# Patient Record
Sex: Female | Born: 1980
Health system: Southern US, Community
[De-identification: ages and names within clinical notes are randomized; demographics above are authoritative.]

## PROBLEM LIST (undated history)

## (undated) ENCOUNTER — Inpatient Hospital Stay (HOSPITAL_COMMUNITY): Payer: Self-pay

## (undated) DIAGNOSIS — Z9889 Other specified postprocedural states: Secondary | ICD-10-CM

## (undated) DIAGNOSIS — R5383 Other fatigue: Secondary | ICD-10-CM

## (undated) DIAGNOSIS — K219 Gastro-esophageal reflux disease without esophagitis: Secondary | ICD-10-CM

## (undated) DIAGNOSIS — E871 Hypo-osmolality and hyponatremia: Secondary | ICD-10-CM

## (undated) DIAGNOSIS — T7840XA Allergy, unspecified, initial encounter: Secondary | ICD-10-CM

## (undated) DIAGNOSIS — O26892 Other specified pregnancy related conditions, second trimester: Secondary | ICD-10-CM

## (undated) DIAGNOSIS — D62 Acute posthemorrhagic anemia: Secondary | ICD-10-CM

## (undated) DIAGNOSIS — E063 Autoimmune thyroiditis: Secondary | ICD-10-CM

## (undated) DIAGNOSIS — N949 Unspecified condition associated with female genital organs and menstrual cycle: Secondary | ICD-10-CM

## (undated) DIAGNOSIS — L509 Urticaria, unspecified: Secondary | ICD-10-CM

## (undated) DIAGNOSIS — N915 Oligomenorrhea, unspecified: Secondary | ICD-10-CM

## (undated) DIAGNOSIS — R112 Nausea with vomiting, unspecified: Secondary | ICD-10-CM

## (undated) DIAGNOSIS — E041 Nontoxic single thyroid nodule: Secondary | ICD-10-CM

## (undated) DIAGNOSIS — R5381 Other malaise: Secondary | ICD-10-CM

## (undated) DIAGNOSIS — E049 Nontoxic goiter, unspecified: Secondary | ICD-10-CM

## (undated) DIAGNOSIS — N943 Premenstrual tension syndrome: Secondary | ICD-10-CM

## (undated) DIAGNOSIS — IMO0001 Reserved for inherently not codable concepts without codable children: Secondary | ICD-10-CM

## (undated) DIAGNOSIS — Z13220 Encounter for screening for lipoid disorders: Secondary | ICD-10-CM

## (undated) HISTORY — DX: Nontoxic goiter, unspecified: E04.9

## (undated) HISTORY — DX: Other malaise: R53.81

## (undated) HISTORY — DX: Other fatigue: R53.83

## (undated) HISTORY — DX: Hypo-osmolality and hyponatremia: E87.1

## (undated) HISTORY — DX: Premenstrual tension syndrome: N94.3

## (undated) HISTORY — DX: Nontoxic single thyroid nodule: E04.1

## (undated) HISTORY — DX: Allergy, unspecified, initial encounter: T78.40XA

## (undated) HISTORY — DX: Urticaria, unspecified: L50.9

## (undated) HISTORY — PX: TOTAL THYROIDECTOMY: SHX2547

## (undated) HISTORY — DX: Gastro-esophageal reflux disease without esophagitis: K21.9

## (undated) HISTORY — DX: Autoimmune thyroiditis: E06.3

## (undated) HISTORY — DX: Oligomenorrhea, unspecified: N91.5

## (undated) HISTORY — DX: Encounter for screening for lipoid disorders: Z13.220

---

## 2005-08-16 ENCOUNTER — Emergency Department: Payer: Self-pay | Admitting: Emergency Medicine

## 2005-08-31 HISTORY — PX: TONSILLECTOMY: SUR1361

## 2006-07-29 ENCOUNTER — Ambulatory Visit: Payer: Self-pay | Admitting: Otolaryngology

## 2006-08-16 ENCOUNTER — Ambulatory Visit: Payer: Self-pay | Admitting: Otolaryngology

## 2006-08-18 ENCOUNTER — Ambulatory Visit: Payer: Self-pay | Admitting: Otolaryngology

## 2006-08-22 ENCOUNTER — Emergency Department: Payer: Self-pay | Admitting: General Practice

## 2009-11-29 ENCOUNTER — Ambulatory Visit: Payer: Self-pay | Admitting: Family Medicine

## 2009-11-29 DIAGNOSIS — K219 Gastro-esophageal reflux disease without esophagitis: Secondary | ICD-10-CM

## 2009-11-29 DIAGNOSIS — J309 Allergic rhinitis, unspecified: Secondary | ICD-10-CM | POA: Insufficient documentation

## 2009-11-29 DIAGNOSIS — N943 Premenstrual tension syndrome: Secondary | ICD-10-CM | POA: Insufficient documentation

## 2009-11-29 DIAGNOSIS — E049 Nontoxic goiter, unspecified: Secondary | ICD-10-CM | POA: Insufficient documentation

## 2009-11-29 DIAGNOSIS — E871 Hypo-osmolality and hyponatremia: Secondary | ICD-10-CM | POA: Insufficient documentation

## 2009-11-29 LAB — CONVERTED CEMR LAB
AST: 16 units/L (ref 0–37)
Albumin: 4 g/dL (ref 3.5–5.2)
Alkaline Phosphatase: 44 units/L (ref 39–117)
Bilirubin, Direct: 0 mg/dL (ref 0.0–0.3)
Calcium: 9.2 mg/dL (ref 8.4–10.5)
GFR calc non Af Amer: 107.14 mL/min (ref >60)
Glucose, Bld: 82 mg/dL (ref 70–99)
HDL: 96 mg/dL (ref >39.00)
LDL Cholesterol: 58 mg/dL (ref 0–99)
Potassium: 3.5 meq/L (ref 3.5–5.1)
Sodium: 132 meq/L — ABNORMAL LOW (ref 135–145)
TSH: 1.01 microintl units/mL (ref 0.35–5.50)
Total Bilirubin: 0.3 mg/dL (ref 0.3–1.2)
Total CHOL/HDL Ratio: 2
VLDL: 18.4 mg/dL (ref 0.0–40.0)

## 2009-12-05 ENCOUNTER — Encounter: Admission: RE | Admit: 2009-12-05 | Discharge: 2009-12-05 | Payer: Self-pay | Admitting: Family Medicine

## 2009-12-06 DIAGNOSIS — E041 Nontoxic single thyroid nodule: Secondary | ICD-10-CM

## 2009-12-29 LAB — CONVERTED CEMR LAB: Pap Smear: NORMAL

## 2010-01-06 ENCOUNTER — Encounter (INDEPENDENT_AMBULATORY_CARE_PROVIDER_SITE_OTHER): Payer: Self-pay | Admitting: *Deleted

## 2010-01-13 ENCOUNTER — Telehealth: Payer: Self-pay | Admitting: Family Medicine

## 2010-01-14 ENCOUNTER — Telehealth: Payer: Self-pay | Admitting: Family Medicine

## 2010-01-14 ENCOUNTER — Ambulatory Visit: Payer: Self-pay

## 2010-01-14 ENCOUNTER — Ambulatory Visit: Payer: Self-pay | Admitting: Family Medicine

## 2010-01-17 ENCOUNTER — Encounter: Payer: Self-pay | Admitting: Family Medicine

## 2010-01-21 ENCOUNTER — Telehealth: Payer: Self-pay | Admitting: Family Medicine

## 2010-06-05 ENCOUNTER — Telehealth: Payer: Self-pay | Admitting: Family Medicine

## 2010-06-06 ENCOUNTER — Encounter: Admission: RE | Admit: 2010-06-06 | Discharge: 2010-06-06 | Payer: Self-pay | Admitting: Family Medicine

## 2010-08-06 ENCOUNTER — Telehealth: Payer: Self-pay | Admitting: Family Medicine

## 2010-09-30 NOTE — Progress Notes (Signed)
Summary: ??[place] ultrasound for tomorrow  Phone Note Call from Patient Call back at Home Phone [phone]   Caller: Patient Call For: Kerby Nora MD Summary of Call: Patient is going tomorrow for an ultrasound of her thyroid tomorrow. She says that she hasn't been feeling good for a couple of weeks and that her lymph node on the right side of her neck is swollen. She wants to know if she should reschedule the ultrasound for after she is feeling better and her lymph node has gone down. Please advise.  Initial call taken by: Melody Comas,  June 05, 2010 9:56 AM  Follow-up for Phone Call        no, go to [name] [facility] MD  June 05, 2010 10:03 AM   Additional Follow-up for Phone Call Additional follow up Details #1::        Patient advised via message on personal cell phone.Consuello Masse CMA   Additional Follow-up by: Benny Lennert CMA Duncan Dull),  June 05, 2010 10:16 AM

## 2010-09-30 NOTE — Miscellaneous (Signed)
Summary: Optometrist Release   Imported By: Beau Fanny 11/29/2009 11:00:15  _____________________________________________________________________  External Attachment:    Type:   Image     Comment:   External Document

## 2010-09-30 NOTE — Progress Notes (Signed)
Summary: Epi Pen  Phone Note Refill Request Message from:  Fax from Pharmacy on Jan 21, 2010 2:59 PM  Refills Requested: Medication #1:  EPIPEN 2-PAK 0.3 MG/0.3ML DEVI 1 injection as directed for anaphylaxsis. [facility]   (Form in your in box)   Method Requested: Electronic Initial call taken by: Delilah Shan CMA Duncan Dull),  Jan 21, 2010 2:59 PM

## 2010-09-30 NOTE — Progress Notes (Signed)
Summary: Call Report/Venous Doppler  Phone Note From Other Clinic   Caller: Mary/Venous Doppler Call For: Dr. Dayton Martes Summary of Call: Negative for DVT.  Patient sent home. Initial call taken by: Linde Gillis CMA Duncan Dull),  Jan 14, 2010 3:21 PM  Follow-up for Phone Call        Aware..forward to Dr. Dayton Martes.  Follow-up by: Kerby Nora MD,  Jan 14, 2010 4:05 PM

## 2010-09-30 NOTE — Assessment & Plan Note (Signed)
Summary: NEW PT TO EST/CLE   Vital Signs:  Patient profile:   30 year old female Height:      66.5 inches Weight:      141 pounds BMI:     22.50 Temp:     98.1 degrees F oral Pulse rate:   72 / minute Pulse rhythm:   regular BP sitting:   110 / 62  (left arm) Cuff size:   regular  Vitals Entered By: Linde Gillis CMA Duncan Dull) (November 29, 2009 9:56 AM) CC: new patient/establish care   History of Present Illness: Here to estab;ish. Doing well overall.    Unexplained food allergy...has had full allergy testing..bloating diarream sweling in hands and face after eating rarely.  Needs refill of epi pen.   Preventive Screening-Counseling & Management  Alcohol-Tobacco     Smoking Status: never  Caffeine-Diet-Exercise     Does Patient Exercise: no      Drug Use:  no.    Problems Prior to Update: None  Current Medications (verified): 1)  Nuvaring 0.12-0.015 Mg/24hr Ring (Etonogestrel-Ethinyl Estradiol) .... Use As Directed  Allergies (verified): No Known Drug Allergies  Past History:     Past Medical History: GYN: Dr. Edward Jolly  Past Surgical History: 2007 tonsillectomy  Family History: Reviewed history and no changes required. mother with celiac disease father: arthritis Aunt: breast cancer 1 brother: healthy  Social History: Reviewed history and no changes required. 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 CV:  Denies chest pain or discomfort. Resp:  Denies shortness of breath. GI:  Denies abdominal pain. GU:  Denies abnormal vaginal bleeding and dysuria. MS:  Denies muscle weakness; upper back pain.Marland Kitchengets massages.Marland Kitchenand  yoga helps a lot. Not severe enough to take pain med.  . Derm:  Denies hair loss. Neuro:  Denies numbness, poor balance, and tingling. Psych:  Denies anxiety and depression. Endo:  Denies cold intolerance, heat intolerance, polyuria, and weight change.  Physical Exam  General:  Well-developed,well-nourished,in no acute distress; alert,appropriate and cooperative throughout examination Head:  no maxillary sinus ttp Eyes:  Mild B conjuctiva erythematous Ears:  clear fluid B TMS Nose:  nasal dischargemucosal pallor.   Mouth:  Oral mucosa and oropharynx without lesions or exudates.  Teeth in good repair. post nasal drip Neck:  no cervical or supraclavicular lymphadenopathy  mild thyromegaly Lungs:  Normal respiratory effort, chest expands symmetrically. Lungs are clear to auscultation, no crackles or wheezes. Heart:  Normal rate and regular rhythm. S1 and S2 normal without gallop, murmur, click, rub or other extra sounds. Abdomen:  Bowel sounds positive,abdomen soft and non-tender without masses, organomegaly or hernias noted. Pulses:  R and L posterior tibial pulses are full and equal bilaterally  Extremities:  no edema Skin:  Intact without suspicious lesions or rashes Psych:  Cognition and judgment appear intact. Alert and cooperative with normal attention span and concentration. No apparent delusions, illusions, hallucinations   Impression & Recommendations:  Problem # 1:  THYROMEGALY (ICD-240.9) grandmother with thyro issues. pt assymptomatic other than thyromegaly.  Orders: TLB-TSH (Thyroid Stimulating Hormone) (84443-TSH)  Problem # 2:  ALLERGIC RHINITIS (ICD-477.9) Zyrtec at bedtime, no decongestant due to nasal dryness, astepro sample she has at home already..more  consistently. Call if not improving for ? nasal steroid.  Her updated medication list for this problem includes:    Zyrtec Hives Relief 10 Mg Tabs (Cetirizine hcl) .Marland Kitchen... 1 tab by mouth at bedtime    Astepro 0.15 %  Soln (Azelastine hcl) .Marland Kitchen... 2 sprays per nostril daily.  Problem # 3:  PAIN IN THORACIC SPINE (ICD-724.1) Musculoskeletal starin. Treat with massage, heat, stretching info given. NSAIDs as needed. Follow up if not improving.  Problem # 4:  SCREENING FOR LIPOID DISORDERS (ICD-V77.91)  Orders: TLB-BMP (Basic Metabolic Panel-BMET) (80048-METABOL) TLB-Hepatic/Liver Function Pnl (80076-HEPATIC) TLB-Lipid Panel (80061-LIPID)  Complete Medication List: 1)  Nuvaring 0.12-0.015 Mg/24hr Ring (Etonogestrel-ethinyl estradiol) .... Use as directed 2)  Zyrtec Hives Relief 10 Mg Tabs (Cetirizine hcl) .Marland Kitchen.. 1 tab by mouth at bedtime 3)  Astepro 0.15 % Soln (Azelastine hcl) .... 2 sprays per nostril daily. 4)  Epipen 2-pak 0.3 Mg/0.20ml Devi (Epinephrine) .Marland Kitchen.. 1 injection as directed for anaphylaxsis  Patient Instructions: 1)  Use Zyrtec at bedtime and asteproduring day 2 sprays per nostril daily. 2)  Heat, massage, gentle stretches for back pain. Restart Yoga. 3)  Follow up if allergies not controlled or back pain not improving. Prescriptions: EPIPEN 2-PAK 0.3 MG/0.3ML DEVI (EPINEPHRINE) 1 injection as directed for anaphylaxsis  #1 pack x 1   Entered and Authorized by:   Kerby Nora MD   Signed by:   Kerby Nora MD on 11/29/2009   Method used:   Electronically to        [facility]* (mail-order)       [address] [place], [place]  [address]       Ph: [phone]       Fax: [phone]   RxID:   [id number]   Current Allergies (reviewed today): No known allergies   Flu Vaccine Result Date:  05/31/2009 Flu Vaccine Result:  given Flu Vaccine Next Due:  1 yr TD Result Date:  08/31/2002 TD Result:  given TD Next Due:  10 yr PAP Result Date:  12/29/2009 PAP Result:  normal PAP Next Due:  1 yr

## 2010-09-30 NOTE — Miscellaneous (Signed)
Summary: Orders Update  Clinical Lists Changes  Orders: Added new Test order of Venous Duplex Lower Extremity (Venous Duplex Lower) - Signed 

## 2010-09-30 NOTE — Progress Notes (Signed)
Summary: has sinus sxs  Phone Note Call from Patient Call back at Home Phone [phone]   Caller: Patient Call For: Kerby Nora MD Summary of Call: Pt called complaing of sinus sxs.  She asked that abx be called in. Advised her that she would need to be seen first but she says she just started a new job and has a $6000.00 insurance deductible so she wont be coming in. Initial call taken by: Lowella Petties CMA, AAMA,  August 06, 2010 11:40 AM  Follow-up for Phone Call        Start with OTc mucinex D, nasal steroid spray three times a day... make appt to be seen if not improving in 4-5 days.  Follow-up by: Kerby Nora MD,  August 06, 2010 11:56 AM  Additional Follow-up for Phone Call Additional follow up Details #1::        Patient advised.Consuello Masse CMA   Additional Follow-up by: Benny Lennert CMA Duncan Dull),  August 07, 2010 8:23 AM

## 2010-09-30 NOTE — Consult Note (Signed)
Summary: [facility]   Imported By: Lanelle Bal 02/06/2010 09:33:16  _____________________________________________________________________  External Attachment:    Type:   Image     Comment:   External Document  Appended Document: Orders Update    Clinical Lists Changes  Observations: Added new observation of TSH: 1 microintl units/mL (01/14/2010 16:44)

## 2010-09-30 NOTE — Progress Notes (Signed)
Summary: pain in lower leg  Phone Note Call from Patient   Caller: Patient Call For: Dr. Patsy Lager Summary of Call: Pt called and stated  that she has pain and swelling in her lower leg.  Not red, not hot or cold to  the touch.  Feels tight.  Pain is constant.  She is concerned because she has been on birth control  pills for several years.  Per Dr. Patsy Lager advised pt she need to go to ER to be evaluated for a clot.  Pt states she doesnt want to pay emergency room fee and will think about it. Initial call taken by: Lowella Petties CMA,  Jan 13, 2010 4:14 PM  Follow-up for Phone Call        Urgent care for eval. This can be emergent prob Follow-up by: Hannah Beat MD,  Jan 13, 2010 4:33 PM    Additional Follow-up for Phone Call Additional follow up Details #2::    Advised pt. Follow-up by: Lowella Petties CMA,  Jan 13, 2010 4:49 PM

## 2010-09-30 NOTE — Letter (Signed)
Summary: [place] No Show Letter  [place] [facility]  [address] [place], [place] [address]   Phone: [phone]  Fax: [phone]    01/06/2010 MRN: [id number]  Lindsey Johnson [address] [place], [place]  [address]   Dear Lindsey Johnson,   Our records indicate that you missed your scheduled appointment with _______Lab______________ on ____5/6/11________.  Please contact this office to reschedule your appointment as soon as possible.  It is important that you keep your scheduled appointments with your physician, so we can provide you the best care possible.  Please be advised that there may be a charge for "no show" appointments.    Sincerely,   [place] at [facility]

## 2010-09-30 NOTE — Assessment & Plan Note (Signed)
Summary: SWOLLEN R LEG/CLE   Vital Signs:  Patient profile:   30 year old female Height:      66.5 inches Weight:      144.13 pounds BMI:     23.00 Temp:     98.7 degrees F oral Pulse rate:   72 / minute Pulse rhythm:   regular BP sitting:   110 / 70  (left arm) Cuff size:   regular  Vitals Entered By: Linde Gillis CMA Duncan Dull) (Jan 14, 2010 11:49 AM) CC: swollen right leg   History of Present Illness: 30 yo with acute onset of right calf pain.  Pain woke her up from sleep sunday night.  Has been taking Ipubrofen and 325 mg ASA daily with no relief of symptoms.  Very sore to touch. No redness or swelling.  No shortness of breath, cough or chest pain.  She is not a smoker but has been on hormonal birth control for 9 years, currently on Nuvaring.  Current Medications (verified): 1)  Nuvaring 0.12-0.015 Mg/24hr Ring (Etonogestrel-Ethinyl Estradiol) .... Use As Directed 2)  Zyrtec Hives Relief 10 Mg Tabs (Cetirizine Hcl) .Marland Kitchen.. 1 Tab By Mouth At Bedtime 3)  Epipen 2-Pak 0.3 Mg/0.64ml Devi (Epinephrine) .Marland Kitchen.. 1 Injection As Directed For Anaphylaxsis  Allergies (verified): No Known Drug Allergies  Review of Systems      See HPI General:  Denies malaise. CV:  Denies chest pain or discomfort. Resp:  Denies shortness of breath.  Physical Exam  General:  Well-developed,well-nourished,in no acute distress; alert,appropriate and cooperative throughout examination Lungs:  Normal respiratory effort, chest expands symmetrically. Lungs are clear to auscultation, no crackles or wheezes. Heart:  Normal rate and regular rhythm. S1 and S2 normal without gallop, murmur, click, rub or other extra sounds. Msk:  Right calf:  tender to palpation, no erythema or swelling, no palpable cord. Pulses:  dorsalis pedis pulses normal bilaterally. Psych:  Cognition and judgment appear intact. Alert and cooperative with normal attention span and concentration. No apparent delusions, illusions,  hallucinations   Impression & Recommendations:  Problem # 1:  CALF PAIN, RIGHT (ICD-729.5) Assessment New History and physically concerning for thrombosis.  Will send for urgent doppler of right lower extremity. Orders: Radiology Referral (Radiology)  Complete Medication List: 1)  Nuvaring 0.12-0.015 Mg/24hr Ring (Etonogestrel-ethinyl estradiol) .... Use as directed 2)  Zyrtec Hives Relief 10 Mg Tabs (Cetirizine hcl) .Marland Kitchen.. 1 tab by mouth at bedtime 3)  Epipen 2-pak 0.3 Mg/0.79ml Devi (Epinephrine) .Marland Kitchen.. 1 injection as directed for anaphylaxsis  Patient Instructions: 1)  nice to meet you. 2)  please stop by to see Shirlee Limerick on your way out. 3)  we will call you as soon as we have results.  Current Allergies (reviewed today): No known allergies

## 2010-09-30 NOTE — Progress Notes (Signed)
Summary: leg is still sore  Phone Note Call from Patient Call back at Home Phone [phone]   Caller: Patient Call For: Dr. Dayton Martes Summary of Call: Pt had doppler study done on her leg today.  There were no clolts so she is asking what is the next step.  Her leg is still very sore, feels tight but not swollen.  Please advise. Initial call taken by: Lowella Petties CMA,  Jan 14, 2010 5:05 PM  Follow-up for Phone Call        I would like to see formal report.  Were their superficial clots?  I would take Ibuprofen 800 mg three times daily until I can see the official report (not just call report).  Thanks. Ruthe Mannan MD  Jan 15, 2010 7:32 AM  Left message on cell phone voicemail advising patient as instructed.   Follow-up by: Linde Gillis CMA Duncan Dull),  Jan 15, 2010 7:58 AM

## 2010-10-06 ENCOUNTER — Telehealth: Payer: Self-pay | Admitting: Family Medicine

## 2010-10-07 ENCOUNTER — Other Ambulatory Visit: Payer: Self-pay | Admitting: Family Medicine

## 2010-10-07 ENCOUNTER — Encounter: Payer: Self-pay | Admitting: Family Medicine

## 2010-10-07 ENCOUNTER — Ambulatory Visit (INDEPENDENT_AMBULATORY_CARE_PROVIDER_SITE_OTHER): Payer: Self-pay | Admitting: Family Medicine

## 2010-10-07 DIAGNOSIS — R5383 Other fatigue: Secondary | ICD-10-CM

## 2010-10-07 DIAGNOSIS — N915 Oligomenorrhea, unspecified: Secondary | ICD-10-CM | POA: Insufficient documentation

## 2010-10-07 DIAGNOSIS — E049 Nontoxic goiter, unspecified: Secondary | ICD-10-CM

## 2010-10-07 DIAGNOSIS — R5381 Other malaise: Secondary | ICD-10-CM | POA: Insufficient documentation

## 2010-10-07 LAB — PREGNANCY SERUM, QUANT: hCG, Beta Chain, Quant, S: <0.5 m[IU]/mL

## 2010-10-07 LAB — T3, FREE: T3, Free: 5.1 pg/mL — ABNORMAL HIGH (ref 2.3–4.2)

## 2010-10-13 ENCOUNTER — Other Ambulatory Visit (HOSPITAL_COMMUNITY): Payer: Self-pay | Admitting: Endocrinology

## 2010-10-13 DIAGNOSIS — E0590 Thyrotoxicosis, unspecified without thyrotoxic crisis or storm: Secondary | ICD-10-CM

## 2010-10-16 NOTE — Assessment & Plan Note (Signed)
Summary: FATIGUE/CLE    [employer]   Vital Signs:  Patient profile:   30 year old female Height:      66.5 inches Weight:      151 pounds BMI:     24.09 Temp:     98.5 degrees F oral Pulse rate:   72 / minute Pulse rhythm:   regular BP sitting:   110 / 62  (left arm) Cuff size:   regular  Vitals Entered By: Benny Lennert CMA Duncan Dull) (October 07, 2010 8:45 AM)  History of Present Illness: Chief complaint fatigue and wants tsh checked  30 year old female with thyromegaly and multinodular goiter.. seen by Dr. Talmage Nap presnets to day with: In the last month ... she has been feeling very fatigued. Last thyroid US.. stable in 05/2010. TSH nml at that time as well. Reviewed last OV note from ENDO: nml TPO antibody titer, nml TSH.  Plan is serial [name] and possible aspiration of nodule considering pt interested in getting pregnant.   Family and friends have noted that thyroid looks larger  than in past.   On prenatal vitamins.  This last menses.Marland Kitchen is 6 days late.  She has stopped OCPs in 07/2010.  MGM: thyrpid issues  More gassy, hiccups.... Has taken multiple home preg test neg  No constipation, has dry skin,  always feels cold but this is usual for her.   Problems Prior to Update: 1)  Calf Pain, Right  (ICD-729.5) 2)  Lymphadenopathy  (ICD-785.6) 3)  Thyroid Nodule  (ICD-241.0) 4)  Hyponatremia  (ICD-276.1) 5)  Thyromegaly  (ICD-240.9) 6)  Allergic Rhinitis  (ICD-477.9) 7)  Pain in Thoracic Spine  (ICD-724.1) 8)  Screening For Lipoid Disorders  (ICD-V77.91) 9)  Migraine, Menstrual  (ICD-625.4) 10)  Gastroesophageal Reflux Disease  (ICD-530.81)  Current Medications (verified): 1)  Epipen 2-Pak 0.3 Mg/0.79ml Devi (Epinephrine) .Marland Kitchen.. 1 Injection As Directed For Anaphylaxsis  Allergies (verified): No Known Drug Allergies  Past History:  Past medical, surgical, family and social histories (including risk factors) reviewed, and no changes noted (except as noted below).  Past  Medical History: Reviewed history from 11/29/2009 and no changes required. GYN: Dr. Edward Jolly  Past Surgical History: Reviewed history from 11/29/2009 and no changes required. 2007 tonsillectomy  Family History: Reviewed history from 11/29/2009 and no changes required. mother with celiac disease father: arthritis Aunt: breast cancer 1 brother: healthy  Social History: Reviewed history from 11/29/2009 and no changes required. 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  Review of Systems General:  Complains of fatigue; denies fever. CV:  Denies chest pain or discomfort. Resp:  Denies shortness of breath.  Physical Exam  General:  Well-developed,well-nourished,in no acute distress; alert,appropriate and cooperative throughout examination Mouth:  MMM Neck:  thyromegaly.. no distinct nodules palpated.  Lungs:  Normal respiratory effort, chest expands symmetrically. Lungs are clear to auscultation, no crackles or wheezes. Heart:  Normal rate and regular rhythm. S1 and S2 normal without gallop, murmur, click, rub or other extra sounds. Abdomen:  Bowel sounds positive,abdomen soft and non-tender without masses, organomegaly or hernias noted.   Impression & Recommendations:  Problem # 1:  FATIGUE (ICD-780.79) Assessment New Eval with labs.  Orders: TLB-TSH (Thyroid Stimulating Hormone) (84443-TSH) TLB-Hemoglobin (Hgb) (85018-HGB)  Problem # 2:  OLIGOMENORRHEA (ICD-626.1) Assessment: New Will eval serum test given pt concern... she feels  like she did when she was preg in past. Orders: TLB-Preg Serum Quant (B-hCG) (84702-HCG-QN)  Problem # 3:  THYROID NODULE (ICD-241.0)  Problem # 4:  THYROMEGALY (ICD-240.9)  Orders: TLB-T3, Free (Triiodothyronine) (84481-T3FREE) TLB-T4 (Thyrox),  Free [phone])  Complete Medication List: 1)  Epipen 2-pak 0.3 Mg/0.33ml Devi (Epinephrine) .Marland Kitchen.. 1 injection as directed for anaphylaxsis   Orders Added: 1)  TLB-Preg Serum Quant (B-hCG) [84702-HCG-QN] 2)  TLB-TSH (Thyroid Stimulating Hormone) [84443-TSH] 3)  TLB-Hemoglobin (Hgb) [85018-HGB] 4)  TLB-T3, Free (Triiodothyronine) [29528-U1LKGM] 5)  TLB-T4 (Thyrox), Free [01027-OZ3G] 6)  Est. Patient Level III [64403]    Current Allergies (reviewed today): No known allergies

## 2010-10-16 NOTE — Progress Notes (Signed)
Summary: wants thyroid checked  Phone Note Call from Patient Call back at Home Phone [phone] [facility]   Call back at [phone]   Caller: Patient Summary of Call: Pt states she stopped taking birth control pills in november, had normal periods in december and january, and is now 5 days late- has done 3 negative pregnancy tests.  She states she is feeling extremely tired and thinks that her problem is her throid and would like to have this checked.  Please advise. Initial call taken by: Lowella Petties CMA, AAMA,  October 06, 2010 1:19 PM  Follow-up for Phone Call        [facility] D[facility] 244.9 Follow-up by: Kerby Nora MD,  October 07, 2010 8:13 AM  Additional Follow-up for Phone Call Additional follow up Details #1::        Patient has appt today can do at appt.Consuello Masse CMA   Additional Follow-up by: Benny Lennert CMA Duncan Dull),  October 07, 2010 8:38 AM

## 2010-10-27 ENCOUNTER — Ambulatory Visit (HOSPITAL_COMMUNITY): Payer: Self-pay

## 2010-10-28 ENCOUNTER — Ambulatory Visit: Payer: Self-pay | Admitting: Otolaryngology

## 2010-10-28 ENCOUNTER — Other Ambulatory Visit (HOSPITAL_COMMUNITY): Payer: Self-pay

## 2010-11-03 ENCOUNTER — Ambulatory Visit: Payer: Self-pay | Admitting: Otolaryngology

## 2010-11-05 LAB — PATHOLOGY REPORT

## 2010-11-10 ENCOUNTER — Ambulatory Visit: Payer: Self-pay | Admitting: Otolaryngology

## 2010-11-17 ENCOUNTER — Ambulatory Visit: Payer: Self-pay | Admitting: Otolaryngology

## 2010-11-24 ENCOUNTER — Ambulatory Visit: Payer: Self-pay | Admitting: Endocrinology

## 2010-12-01 ENCOUNTER — Ambulatory Visit (INDEPENDENT_AMBULATORY_CARE_PROVIDER_SITE_OTHER): Payer: BC Managed Care – PPO | Admitting: Family Medicine

## 2010-12-01 ENCOUNTER — Encounter: Payer: Self-pay | Admitting: Family Medicine

## 2010-12-01 VITALS — BP 110/70 | HR 92 | Temp 98.3°F | Ht 66.0 in | Wt 154.1 lb

## 2010-12-01 DIAGNOSIS — J029 Acute pharyngitis, unspecified: Secondary | ICD-10-CM

## 2010-12-01 DIAGNOSIS — B9789 Other viral agents as the cause of diseases classified elsewhere: Secondary | ICD-10-CM

## 2010-12-01 LAB — POCT RAPID STREP A (OFFICE): Rapid Strep A Screen: NEGATIVE

## 2010-12-01 NOTE — Patient Instructions (Signed)
Drink plenty of fluids, take tylenol as needed, and gargle with warm salt water for your throat.  This should gradually improve.  Take care.  Let us know if you have other concerns.    

## 2010-12-01 NOTE — Progress Notes (Signed)
duration of symptoms: Started last night with ST.   rhinorrhea:no congestion:no ear pain:no sore throat: yes Cough: no Myalgias:yes other concerns: takes zyrtec for allergies.   + Nausea, sinus pressure.  Some diffuse abdominal pain. No fever.    Works at a [employer].    ROS: See HPI.  Otherwise negative.    Meds, vitals, and allergies reviewed.   GEN: nad, alert and oriented HEENT: mucous membranes moist, TM w/o erythema, nasal epithelium injected, OP with cobblestoning but no exudates, max sinus mildly ttp bilaterally NECK: supple w/o LA, surgical scar healing CV: rrr. PULM: ctab, no inc wob ABD: soft, +bs, no focal tenderness, no RLQ tenderness, no rebound EXT: no edema

## 2010-12-01 NOTE — Assessment & Plan Note (Signed)
Nontoxic, not dehydrated, RST neg and okay for outpatient fu.  See instructions.

## 2010-12-03 ENCOUNTER — Telehealth: Payer: Self-pay | Admitting: *Deleted

## 2010-12-03 MED ORDER — ONDANSETRON HCL 4 MG PO TABS: 4.0000 mg | ORAL_TABLET | Freq: Three times a day (TID) | ORAL | Status: DC | PRN

## 2010-12-03 NOTE — Telephone Encounter (Signed)
Please call in (I didn't send it electronically- please verify the pharmacy).  Thanks.

## 2010-12-03 NOTE — Telephone Encounter (Signed)
Patient was seen for virus on Monday and she says that she is still feeling nauseated and has been throwing up. She is asking if she can have Zofran called in. Please advise. [facility]

## 2010-12-03 NOTE — Telephone Encounter (Signed)
Phoned to pharmacy 

## 2010-12-29 ENCOUNTER — Ambulatory Visit: Payer: Self-pay | Admitting: Endocrinology

## 2011-01-29 ENCOUNTER — Ambulatory Visit: Payer: Self-pay | Admitting: Endocrinology

## 2011-02-15 IMAGING — US US SOFT TISSUE HEAD/NECK
1 series · 14 of 25 positions shown · non-contrast
Comparison: None.

CLINICAL DATA: Thyroid enlargement.

THYROID ULTRASOUND
TECHNIQUE: Ultrasound examination of the thyroid gland and
adjacent soft tissues was performed.

[Series 1: us soft tissue head/neck · 0.06mm/px · 14 of 34 slices shown]
[im 1/34]
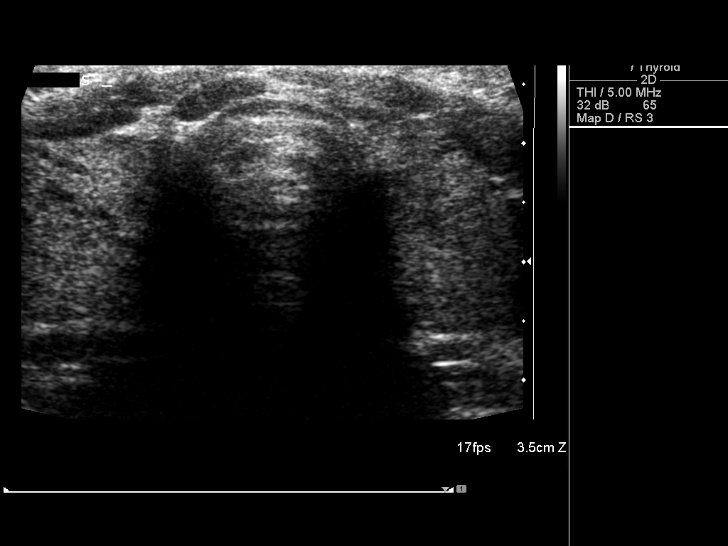
[im 3/34]
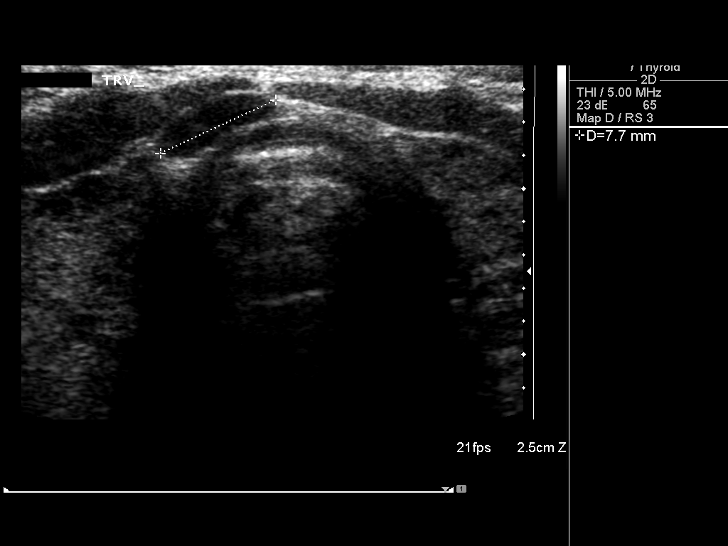
[im 6/34]
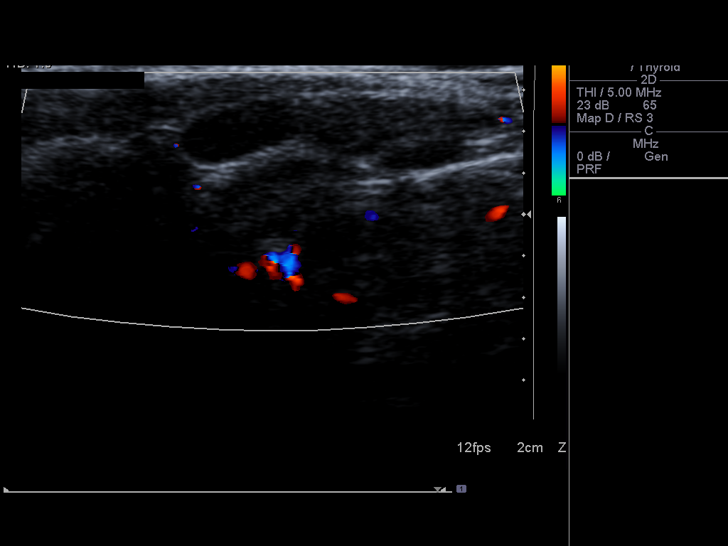
[im 9/34]
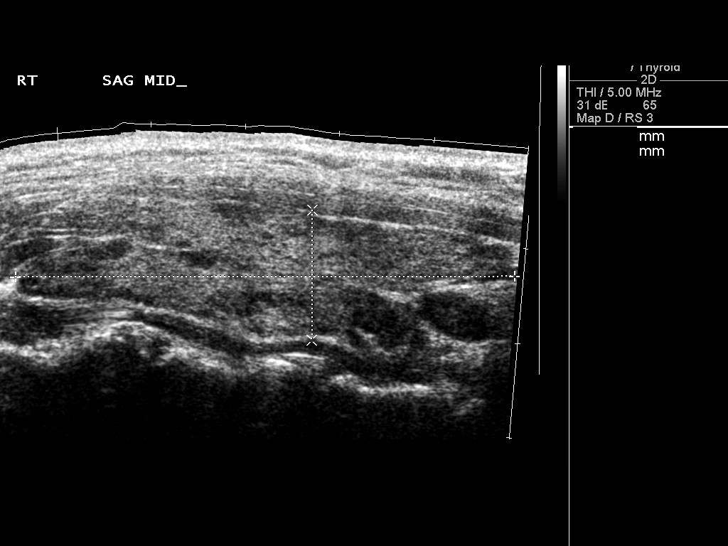
[im 12/34]
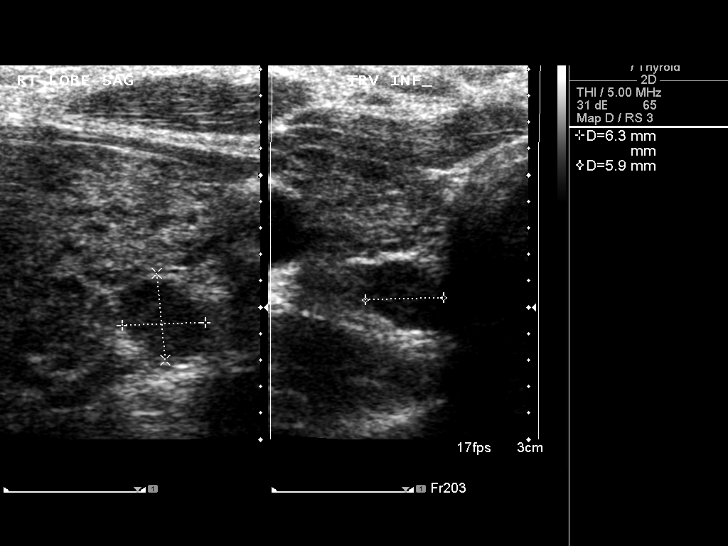
[im 13/34]
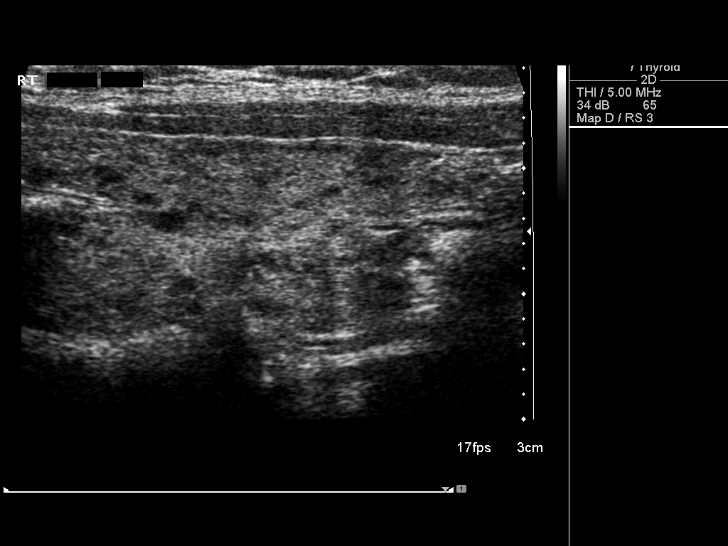
[im 16/34]
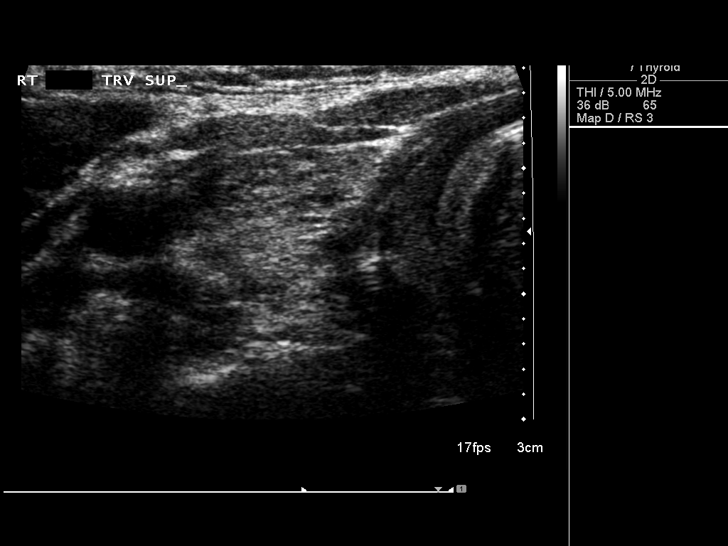
[im 18/34]
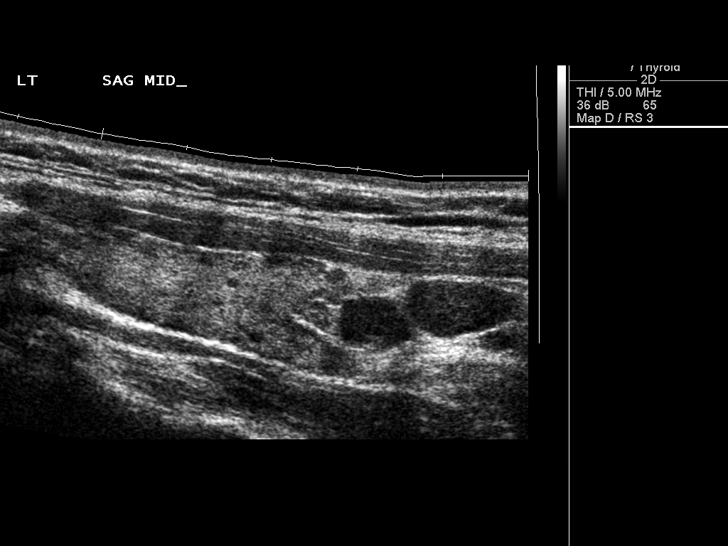
[im 21/34]
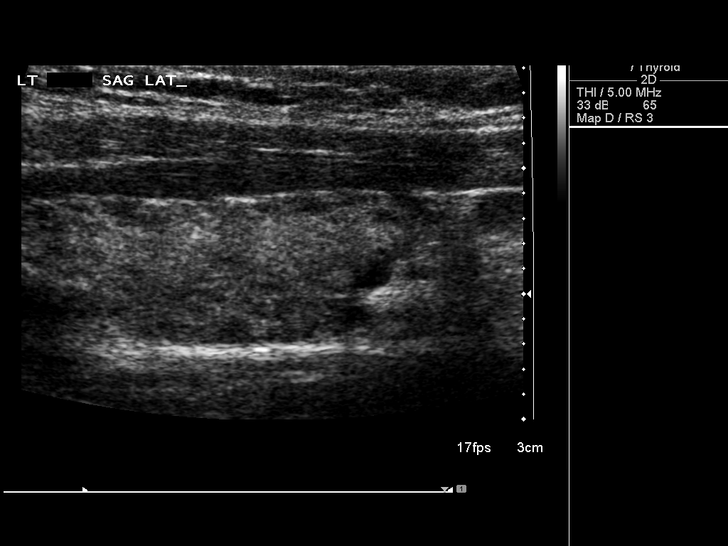
[im 23/34]
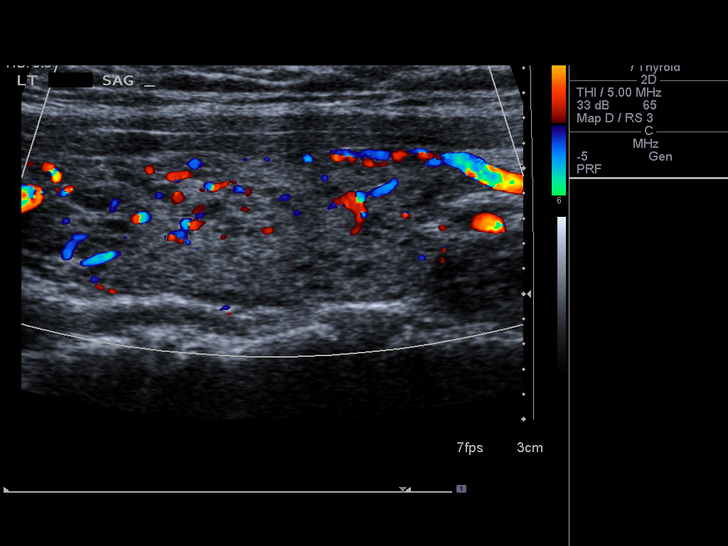
[im 25/34]
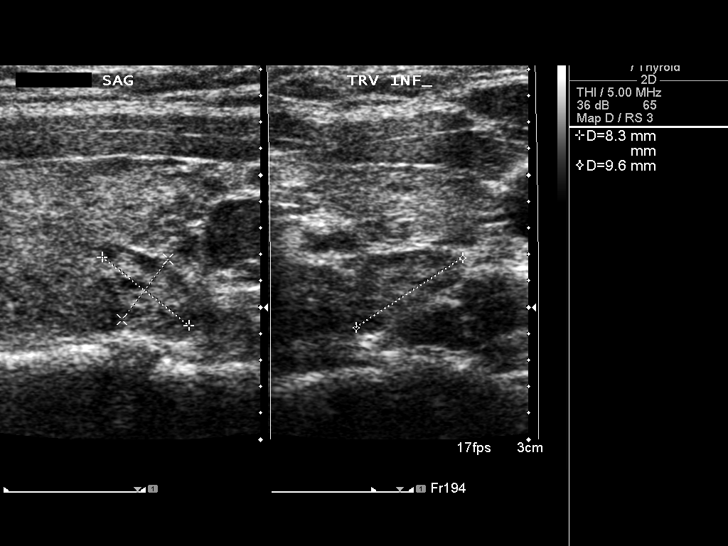
[im 28/34]
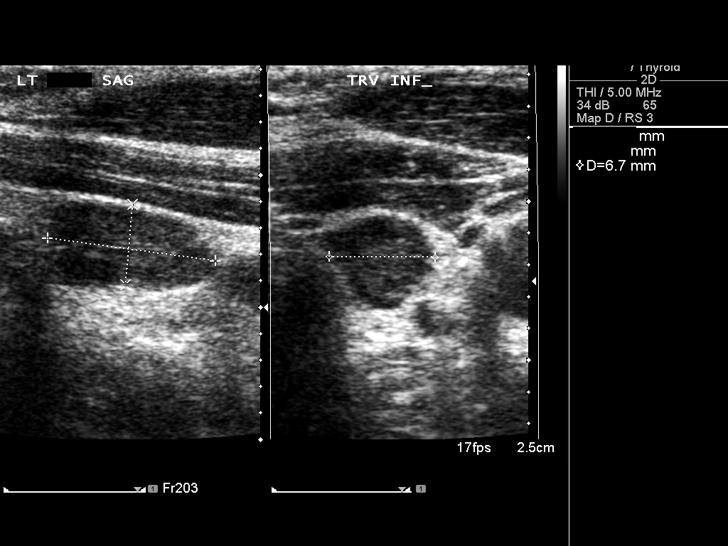
[im 31/34]
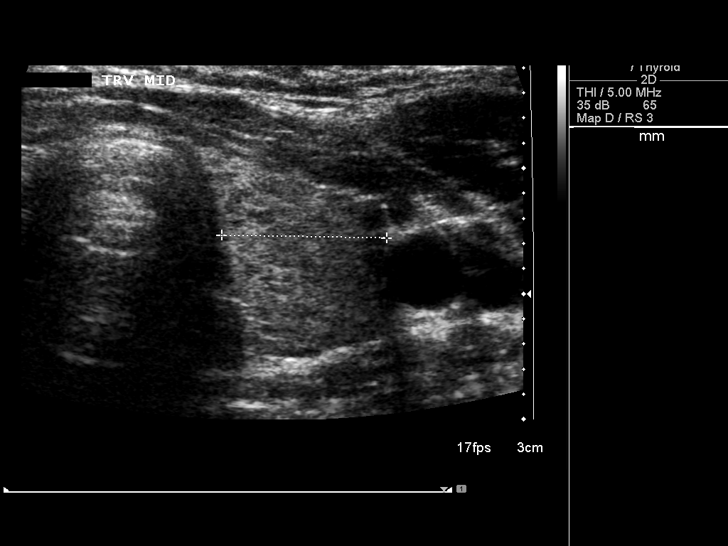
[im 34/34]
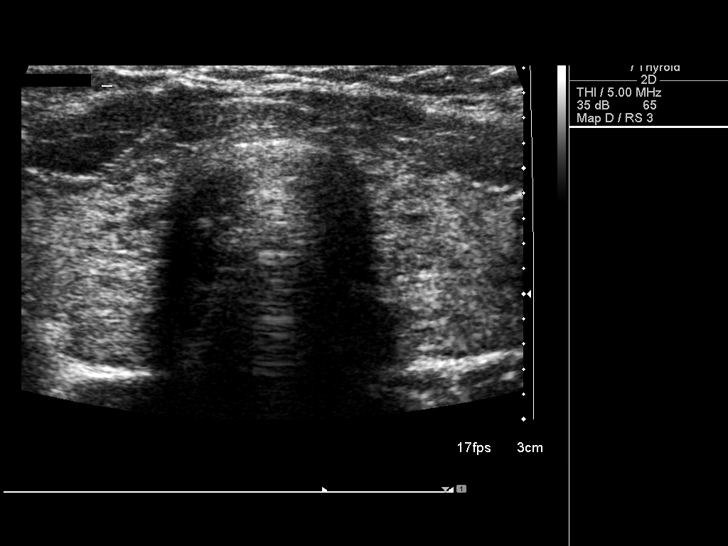

[14 of 25 positions shown; findings below may reference images not displayed]

FINDINGS: Right thyroid lobe measures 5.3 x 1.4 x 1.5 cm.  Left
thyroid lobe measures 4.9 x 1.1 x 1.3 cm.  Isthmus is 2 mm.

Numerous bilateral subcentimeteric nodules are noted.  The largest
on the right is in the mid pole measuring up to 8 mm.  The largest
on the left is in the lower pole measuring up to 8 mm.  No dominant
nodule.  Gland is diffusely heterogeneous.

Multiple left inferior cervical lymph nodes are noted, below the
thyroid lobe.  The largest measures 1.3 x 0.7 x 0.6 cm.
IMPRESSION: Diffusely heterogeneous thyroid with numerous subcentimeteric
nodules.  No dominant nodule.

Multiple mildly prominent borderline sized cervical lymph nodes
inferior to the left thyroid lobe.  Recommend clinical follow-up to
assure these do not enlarged further.

## 2011-03-11 ENCOUNTER — Ambulatory Visit: Payer: BC Managed Care – PPO | Admitting: Family Medicine

## 2011-04-06 ENCOUNTER — Other Ambulatory Visit: Payer: Self-pay

## 2011-04-17 ENCOUNTER — Encounter (HOSPITAL_COMMUNITY): Payer: Self-pay | Admitting: Anesthesiology

## 2011-04-17 ENCOUNTER — Other Ambulatory Visit: Payer: Self-pay | Admitting: Obstetrics and Gynecology

## 2011-04-17 ENCOUNTER — Ambulatory Visit (HOSPITAL_COMMUNITY)
Admission: RE | Admit: 2011-04-17 | Discharge: 2011-04-17 | Disposition: A | Discharge status: Home / Self Care | Payer: BC Managed Care – PPO | Source: Ambulatory Visit | Attending: Obstetrics and Gynecology | Admitting: Obstetrics and Gynecology

## 2011-04-17 ENCOUNTER — Ambulatory Visit (HOSPITAL_COMMUNITY): Payer: BC Managed Care – PPO | Admitting: Anesthesiology

## 2011-04-17 ENCOUNTER — Encounter (HOSPITAL_COMMUNITY): Payer: Self-pay | Admitting: *Deleted

## 2011-04-17 ENCOUNTER — Encounter (HOSPITAL_COMMUNITY)
Admission: RE | Disposition: A | Discharge status: Home / Self Care | Payer: Self-pay | Source: Ambulatory Visit | Attending: Obstetrics and Gynecology

## 2011-04-17 DIAGNOSIS — O021 Missed abortion: Secondary | ICD-10-CM | POA: Insufficient documentation

## 2011-04-17 HISTORY — PX: DILATION AND EVACUATION: SHX1459

## 2011-04-17 LAB — URINE MICROSCOPIC-ADD ON

## 2011-04-17 LAB — CBC
HCT: 39.1 % (ref 36.0–46.0)
Hemoglobin: 13.3 g/dL (ref 12.0–15.0)
MCV: 87.1 fL (ref 78.0–100.0)
RBC: 4.49 MIL/uL (ref 3.87–5.11)
RDW: 13.3 % (ref 11.5–15.5)
WBC: 8.5 10*3/uL (ref 4.0–10.5)

## 2011-04-17 LAB — ABO/RH: ABO/RH(D): O POS

## 2011-04-17 LAB — URINALYSIS, ROUTINE W REFLEX MICROSCOPIC
Bilirubin Urine: NEGATIVE
Specific Gravity, Urine: 1.02 (ref 1.005–1.030)
Urobilinogen, UA: 1 mg/dL (ref 0.0–1.0)
pH: 7 (ref 5.0–8.0)

## 2011-04-17 SURGERY — DILATION AND EVACUATION, UTERUS
Anesthesia: Monitor Anesthesia Care | Site: Vagina | Wound class: Clean Contaminated

## 2011-04-17 MED ORDER — PROPOFOL 10 MG/ML IV EMUL
INTRAVENOUS | Status: AC
  Filled 2011-04-17: qty 20

## 2011-04-17 MED ORDER — SCOPOLAMINE 1 MG/3DAYS TD PT72
MEDICATED_PATCH | TRANSDERMAL | Status: AC
  Administered 2011-04-17: 1.5 mg via TRANSDERMAL
  Filled 2011-04-17: qty 1

## 2011-04-17 MED ORDER — METOCLOPRAMIDE HCL 10 MG PO TABS
ORAL_TABLET | ORAL | Status: AC
  Administered 2011-04-17: 10 mg via ORAL
  Filled 2011-04-17: qty 1

## 2011-04-17 MED ORDER — FENTANYL CITRATE 0.05 MG/ML IJ SOLN
INTRAMUSCULAR | Status: AC
  Filled 2011-04-17: qty 5

## 2011-04-17 MED ORDER — MIDAZOLAM HCL 2 MG/2ML IJ SOLN
INTRAMUSCULAR | Status: AC
  Filled 2011-04-17: qty 2

## 2011-04-17 MED ORDER — ONDANSETRON HCL 4 MG/2ML IJ SOLN
INTRAMUSCULAR | Status: AC
  Filled 2011-04-17: qty 2

## 2011-04-17 MED ORDER — LIDOCAINE HCL (CARDIAC) 20 MG/ML IV SOLN
INTRAVENOUS | Status: DC | PRN
  Administered 2011-04-17: 30 mg via INTRAVENOUS

## 2011-04-17 MED ORDER — METOCLOPRAMIDE HCL 10 MG PO TABS
10.0000 mg | ORAL_TABLET | Freq: Once | ORAL | Status: AC
  Administered 2011-04-17: 10 mg via ORAL

## 2011-04-17 MED ORDER — LACTATED RINGERS IV SOLN
INTRAVENOUS | Status: DC
  Administered 2011-04-17: 12:00:00 via INTRAVENOUS

## 2011-04-17 MED ORDER — PROPOFOL 10 MG/ML IV EMUL
INTRAVENOUS | Status: DC | PRN
  Administered 2011-04-17: 25 mg via INTRAVENOUS
  Administered 2011-04-17 (×2): 20 mg via INTRAVENOUS
  Administered 2011-04-17 (×2): 10 mg via INTRAVENOUS
  Administered 2011-04-17: 15 mg via INTRAVENOUS
  Administered 2011-04-17 (×2): 20 mg via INTRAVENOUS

## 2011-04-17 MED ORDER — DOXYCYCLINE HYCLATE 100 MG PO TABS
ORAL_TABLET | ORAL | Status: AC
  Administered 2011-04-17: 100 mg via ORAL
  Filled 2011-04-17: qty 1

## 2011-04-17 MED ORDER — SCOPOLAMINE 1 MG/3DAYS TD PT72
1.0000 | MEDICATED_PATCH | Freq: Once | TRANSDERMAL | Status: DC
  Administered 2011-04-17: 1.5 mg via TRANSDERMAL

## 2011-04-17 MED ORDER — LIDOCAINE HCL (CARDIAC) 20 MG/ML IV SOLN
INTRAVENOUS | Status: AC
  Filled 2011-04-17: qty 5

## 2011-04-17 MED ORDER — FENTANYL CITRATE 0.05 MG/ML IJ SOLN: 25.0000 ug | INTRAMUSCULAR | Status: DC | PRN

## 2011-04-17 MED ORDER — KETOROLAC TROMETHAMINE 30 MG/ML IJ SOLN
INTRAMUSCULAR | Status: AC
  Filled 2011-04-17: qty 1

## 2011-04-17 MED ORDER — FENTANYL CITRATE 0.05 MG/ML IJ SOLN
INTRAMUSCULAR | Status: DC | PRN
  Administered 2011-04-17 (×3): 50 ug via INTRAVENOUS

## 2011-04-17 MED ORDER — DOXYCYCLINE HYCLATE 100 MG PO TABS
100.0000 mg | ORAL_TABLET | Freq: Once | ORAL | Status: AC
  Administered 2011-04-17: 100 mg via ORAL

## 2011-04-17 MED ORDER — KETOROLAC TROMETHAMINE 30 MG/ML IJ SOLN: 15.0000 mg | Freq: Once | INTRAMUSCULAR | Status: DC | PRN

## 2011-04-17 MED ORDER — MIDAZOLAM HCL 5 MG/5ML IJ SOLN
INTRAMUSCULAR | Status: DC | PRN
  Administered 2011-04-17: 2 mg via INTRAVENOUS

## 2011-04-17 MED ORDER — ONDANSETRON HCL 4 MG/2ML IJ SOLN
INTRAMUSCULAR | Status: DC | PRN
  Administered 2011-04-17: 4 mg via INTRAVENOUS

## 2011-04-17 MED ORDER — KETOROLAC TROMETHAMINE 30 MG/ML IJ SOLN
INTRAMUSCULAR | Status: DC | PRN
  Administered 2011-04-17: 30 mg via INTRAVENOUS

## 2011-04-17 MED ORDER — LIDOCAINE HCL 1 % IJ SOLN
INTRAMUSCULAR | Status: DC | PRN
  Administered 2011-04-17: 10 mL

## 2011-04-17 SURGICAL SUPPLY — 18 items
CATH ROBINSON RED A/P 16FR (CATHETERS) ×2 IMPLANT
CLOTH BEACON ORANGE TIMEOUT ST (SAFETY) ×2 IMPLANT
DECANTER SPIKE VIAL GLASS SM (MISCELLANEOUS) ×2 IMPLANT
DRAPE UTILITY XL STRL (DRAPES) ×2 IMPLANT
GLOVE BIO SURGEON STRL SZ 6.5 (GLOVE) ×4 IMPLANT
GOWN PREVENTION PLUS LG XLONG (DISPOSABLE) ×2 IMPLANT
KIT BERKELEY 1ST TRIMESTER 3/8 (MISCELLANEOUS) ×2 IMPLANT
NEEDLE SPNL 22GX3.5 QUINCKE BK (NEEDLE) ×2 IMPLANT
NS IRRIG 1000ML POUR BTL (IV SOLUTION) ×2 IMPLANT
PACK VAGINAL MINOR WOMEN LF (CUSTOM PROCEDURE TRAY) ×2 IMPLANT
PAD PREP 24X48 CUFFED NSTRL (MISCELLANEOUS) ×2 IMPLANT
SET BERKELEY SUCTION TUBING (SUCTIONS) ×2 IMPLANT
SYR CONTROL 10ML LL (SYRINGE) ×2 IMPLANT
TOWEL OR 17X24 6PK STRL BLUE (TOWEL DISPOSABLE) ×4 IMPLANT
VACURETTE 10 RIGID CVD (CANNULA) IMPLANT
VACURETTE 7MM CVD STRL WRAP (CANNULA) IMPLANT
VACURETTE 8 RIGID CVD (CANNULA) IMPLANT
VACURETTE 9 RIGID CVD (CANNULA) IMPLANT

## 2011-04-17 NOTE — Transfer of Care (Signed)
Immediate Anesthesia Transfer of Care Note  Patient: Lindsey Johnson  Procedure(s) Performed:  DILATATION AND EVACUATION (D&E)  Patient Location: PACU  Anesthesia Type: MAC  Level of Consciousness: awake, oriented and patient cooperative  Airway & Oxygen Therapy: Patient Spontanous Breathing  Post-op Assessment: Report given to PACU RN, Post -op Vital signs reviewed and stable and Patient moving all extremities  Post vital signs: Reviewed and stable  Complications: No apparent anesthesia complications

## 2011-04-17 NOTE — Anesthesia Preprocedure Evaluation (Signed)
Anesthesia Evaluation  Name, MR# and DOB Patient awake  General Assessment Comment  Reviewed: Allergy & Precautions, H&P , NPO status , Patient's Chart, lab work & pertinent test results, reviewed documented beta blocker date and time   History of Anesthesia Complications Negative for: history of anesthetic complications  Airway Mallampati: II TM Distance: >3 FB Neck ROM: full  Mouth opening: Limited Mouth Opening Comment: +TMJ - small mouth opening Dental  (+) Teeth Intact   Pulmonary  clear to auscultation  breath sounds clear to auscultation none    Cardiovascular Exercise Tolerance: Good regular Normal    Neuro/Psych   Headaches (menstrual migraines)   Negative Psych ROS  GI/Hepatic/Renal negative Liver ROS, and negative Renal ROS (+)  GERD (no meds)      Endo/Other  S/p thyroidectomy for hashimoto's/goiter.  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  No horseness, dysphagia or dyspnea.  Abdominal   Musculoskeletal   Hematology negative hematology ROS (+)   Peds  Reproductive/Obstetrics (+) Pregnancy (6-9 weeks missed ab)    Anesthesia Other Findings             Anesthesia Physical Anesthesia Plan  ASA: II  Anesthesia Plan: MAC   Post-op Pain Management:    Induction:   Airway Management Planned:   Additional Equipment:   Intra-op Plan:   Post-operative Plan:   Informed Consent: I have reviewed the patients History and Physical, chart, labs and discussed the procedure including the risks, benefits and alternatives for the proposed anesthesia with the patient or authorized representative who has indicated his/her understanding and acceptance.   Dental Advisory Given  Plan Discussed with: CRNA and Surgeon  Anesthesia Plan Comments: (Avoid GETA because of right vocal cord paralysis.  Plan for MAC/local.)        Anesthesia Quick  Evaluation

## 2011-04-17 NOTE — Op Note (Signed)
NAMEMAKAELA, CANDO                  ACCOUNT NO.:  [id number]  MEDICAL RECORD NO.:  [id number]  LOCATION:  [facility]                          FACILITY:  [facility]  PHYSICIAN:  Randye Lobo, M.D.   DATE OF BIRTH:  [birth year]  DATE OF PROCEDURE:  04/17/2011 DATE OF DISCHARGE:  04/17/2011                              OPERATIVE REPORT   PREOPERATIVE DIAGNOSIS:  Missed abortion  POSTOPERATIVE DIAGNOSIS:  Missed abortion.  PROCEDURE:  Dilation and evacuation  SURGEON:  Conley Simmonds, MD  ANESTHESIA:  MAC, paracervical block with 1% lidocaine.  IV FLUIDS:  800 mL Ringer lactate.  ESTIMATED BLOOD LOSS:  Minimal.  URINE OUTPUT:  5 mL by I and O catheterization prior to procedure.  COMPLICATIONS:  None.  INDICATIONS FOR PROCEDURE:  The patient is a 30 year old gravida 1, para 0-0-1-0 Caucasian female who presents with a last menstrual period on February 04, 2011, who was noted to have a nonviable pregnancy on ultrasound examination 1 week ago.  The crown rump length at that time measured 6 weeks' gestation.  The gestational sac measured larger.  The decision was made to follow up with a repeat ultrasound this week which again confirmed the same information, that the pregnancy was nonviable.  The patient was presented with options for care.  She chose to proceed with a dilation and evacuation procedure after risks, benefits, and alternatives were reviewed.  FINDINGS:  Examination under anesthesia revealed an 8-week size retroverted mobile uterus.  No adnexal masses were appreciated.  A moderate amount of products of conception were obtained.  PROCEDURE:  The patient was reidentified in the preoperative hold area. She received doxycycline 100 mg orally for antibiotic prophylaxis.  In the operating room, the patient was placed supine on the operating room table, and she received MAC anesthesia.  She was placed in the dorsal lithotomy position.  The vagina and perineum were sterilely prepped, and  she was catheterized of urine.  She was sterilely draped.  The procedure began with an examination under anesthesia.  A speculum was placed inside the vagina and a single-tooth tenaculum was placed on the anterior cervical lip.  A paracervical block was performed with a total of 10 mL of 1% lidocaine plain.  The uterus was sounded to almost 9 cm, the cervix was then sequentially dilated to a #25 Pratt dilator. A #8 suction tip curette was then introduced through the cervix to the level of the uterine fundus.  It was withdrawn slightly.  Proper suction was applied, and the suction tip was then turned in a clockwise fashion as it was removed from within the uterine cavity.  This was repeated a second time.  Gentle sharp curettage was then performed in all 4 quadrants, and there were no remaining products of conception.  The suction tip curette with suction was used one final time and again no remaining products were obtained.  The products of conception were sent to Pathology.  The vaginal instruments were removed.  The patient was cleansed of Betadine.  She was awakened and escorted to the recovery room in stable condition.  There were no complications to the procedure.  All needle, instrument, sponge  counts were correct.     Randye Lobo, M.D.     BES/MEDQ  D:  04/17/2011  T:  04/17/2011  Job:  [id number]

## 2011-04-17 NOTE — H&P (Signed)
Lindsey Johnson, HAUSNER                  ACCOUNT NO.:  [id number]  MEDICAL RECORD NO.:  [id number]  LOCATION:  [facility]                         [facility]:  [facility]  PHYSICIAN:  Randye Lobo, M.D.   DATE OF BIRTH:  07/12/1981  DATE OF ADMISSION:  04/16/2011 DATE OF DISCHARGE:                             HISTORY & PHYSICAL   CHIEF COMPLAINT:  Miscarriage.  HISTORY OF PRESENT ILLNESS:  The patient is a 30 year old, gravida 2, Caucasian female, who presents with a last menstrual period February 04, 2011, who on ultrasound is noted to have a nonviable pregnancy.  The patient presented for care on April 09, 2011, at which time, a vaginal ultrasound showed an intrauterine gestation measuring 6 weeks' with no evidence of fetal cardiac activity.  The patient subsequently had a follow-up ultrasound on April 16, 2011, which documented an intrauterine gestation with a fetal crown rump length again consistent with 6 weeks' gestation and no evidence of fetal cardiac activity.  The gestational sac measured 10 +1 weeks.  The patient wishes to proceed with a dilation and evacuation procedure.  PAST OBSTETRIC AND GYNECOLOGIC HISTORY:  The patient is a gravida 2, para 0-0-1-0, status post LEEP procedure with follow-up Pap smears normal.  PAST MEDICAL HISTORY: 1. Menstrual migraines. 2. Hyperthyroidism.  Status post total thyroidectomy, November 03, 2010.     The patient sustained recurrent laryngeal nerve damage.  She has     paralysis of the right vocal cord, which remains in a closed     position.  PAST SURGICAL HISTORY: 1. Status post LEEP procedure. 2. Status post tonsillectomy. 3. Status post total thyroidectomy.  MEDICATIONS:  Levothyroxine, prenatal vitamins, calcium.  ALLERGIES:  No known drug allergies.  SOCIAL HISTORY:  The patient is married.  She is a Engineer, civil (consulting).  She denies the use of tobacco.  FAMILY HISTORY:  Positive for an aunt with breast cancer.  The remainder of the family history is  noncontributory.  PHYSICAL EXAM:  VITAL SIGNS:  Height 5 feet 7 inches, weight 152 pounds, blood pressure 100/60. LUNGS:  Clear to auscultation bilaterally. HEART:  S1-S2 with a regular rate and rhythm. ABDOMEN:  Soft and nontender.  No evidence of hepatosplenomegaly or organomegaly. PELVIC:  Normal external genitalia and urethra.  The cervix and vagina are normal.  The uterus is small, retroverted, nontender.  No adnexal masses are appreciated.  IMPRESSION:  The patient is a 30 year old, gravida 2, para 0-0-1-0 female with a missed abortion.  PLAN:  The patient will undergo a dilation and evacuation procedure at the [facility] on April 17, 2011.  Risks, benefits, and alternatives have been reviewed with the patient, who wishes to proceed.     Randye Lobo, M.D.     BES/MEDQ  D:  04/17/2011  T:  04/17/2011  Job:  [id number]

## 2011-04-17 NOTE — Progress Notes (Signed)
Pre op Note  No bleeding or cramping.  Blood type O+  Ok for proceed with dilation and evacuation.

## 2011-04-17 NOTE — Anesthesia Postprocedure Evaluation (Signed)
Anesthesia Post Note  Patient: Lindsey Johnson  Procedure(s) Performed:  DILATATION AND EVACUATION (D&E)  Anesthesia type: MAC/Local  Patient location: PACU  Post pain: Pain level controlled  Post assessment: Post-op Vital signs reviewed  Last Vitals:  Filed Vitals:   04/17/11 1315  BP: 101/63  Pulse: 63  Temp:   Resp: 14    Post vital signs: Reviewed  Level of consciousness: sedated  Complications: No apparent anesthesia complications

## 2011-04-17 NOTE — Brief Op Note (Signed)
04/17/2011  12:49 PM  PATIENT:  Lindsey Johnson  30 y.o. female  PRE-OPERATIVE DIAGNOSIS:  missed abortion at 6 weeks  POST-OPERATIVE DIAGNOSIS:  missed abortion at 6 weeks  PROCEDURE:  Procedure(s): DILATATION AND EVACUATION (D&E)  SURGEON:  Surgeon(s): [employer]  PHYSICIAN ASSISTANT:   ASSISTANTS: none   ANESTHESIA:   IV sedation  ESTIMATED BLOOD LOSS: Minimal.  BLOOD ADMINISTERED:none  DRAINS: none   LOCAL MEDICATIONS USED:  LIDOCAINE 10CC  SPECIMEN:  Source of Specimen:  products of conception  DISPOSITION OF SPECIMEN:  PATHOLOGY  COUNTS:  YES  TOURNIQUET:    DICTATION #:   PLAN OF CARE: discharge to home.  PATIENT DISPOSITION:  PACU - hemodynamically stable.   Delay start of Pharmacological VTE agent (>24hrs) due to surgical blood loss or risk of bleeding:  not applicable

## 2011-04-21 ENCOUNTER — Encounter (HOSPITAL_COMMUNITY): Payer: Self-pay | Admitting: Obstetrics and Gynecology

## 2011-07-09 ENCOUNTER — Encounter: Payer: Self-pay | Admitting: Family Medicine

## 2011-07-09 ENCOUNTER — Ambulatory Visit (INDEPENDENT_AMBULATORY_CARE_PROVIDER_SITE_OTHER): Payer: BC Managed Care – PPO | Admitting: Family Medicine

## 2011-07-09 VITALS — BP 90/60 | HR 67 | Temp 98.4°F | Ht 66.0 in | Wt 156.5 lb

## 2011-07-09 DIAGNOSIS — J329 Chronic sinusitis, unspecified: Secondary | ICD-10-CM

## 2011-07-09 DIAGNOSIS — E041 Nontoxic single thyroid nodule: Secondary | ICD-10-CM

## 2011-07-09 MED ORDER — AMOXICILLIN-POT CLAVULANATE 875-125 MG PO TABS: 1.0000 | ORAL_TABLET | Freq: Two times a day (BID) | ORAL | Status: AC

## 2011-07-09 NOTE — Progress Notes (Signed)
SUBJECTIVE:  Lindsey Johnson is a 30 y.o. female who complains of coryza, congestion, sneezing, dry cough and bilateral sinus pain for 8 days. She denies a history of anorexia, chest pain, shortness of breath and vomiting and denies a history of asthma. Patient denies smoke cigarettes.   Patient Active Problem List  Diagnoses  . THYROMEGALY  . THYROID NODULE  . HYPONATREMIA  . ALLERGIC RHINITIS  . GASTROESOPHAGEAL REFLUX DISEASE  . MIGRAINE, MENSTRUAL  . OLIGOMENORRHEA  . FATIGUE  . Unspecified viral infection, in conditions classified elsewhere and of unspecified site   Past Medical History  Diagnosis Date  . Allergy   . GERD (gastroesophageal reflux disease)   . Other malaise and fatigue   . Hyposmolality and/or hyponatremia   . Premenstrual tension syndromes   . Scanty or infrequent menstruation   . Screening for lipoid disorders   . Nontoxic uninodular goiter   . Goiter, unspecified   . Hashimoto's thyroiditis   . Hives     and history of lip edema but no other airway symptoms. last symptoms in 2010   Past Surgical History  Procedure Date  . Tonsillectomy 2007  . Total thyroidectomy     2012  . Dilation and evacuation 04/17/2011    Procedure: DILATATION AND EVACUATION (D&E);  Surgeon: Melony Overly;  Location: [facility];  Service: Gynecology;  Laterality: N/A;   History  Substance Use Topics  . Smoking status: Never Smoker   . Smokeless tobacco: Not on file  . Alcohol Use: Yes     1 glass 2-3 times per week.   Family History  Problem Relation Age of Onset  . Arthritis Father    No Known Allergies Current Outpatient Prescriptions on File Prior to Visit  Medication Sig Dispense Refill  . EPINEPHrine (EPIPEN JR) 0.15 MG/0.3ML injection Inject 0.15 mg into the muscle as needed.       Marland Kitchen levothyroxine (SYNTHROID, LEVOTHROID) 112 MCG tablet Take 112 mcg by mouth daily.          OBJECTIVE: BP 90/60  Pulse 67  Temp(Src) 98.4 F (36.9 C) (Oral)  Ht 5\' 6"  (1.676 m)  Wt  156 lb 8 oz (70.988 kg)  BMI 25.26 kg/m2  LMP 07/03/2011  Breastfeeding? Unknown  She appears well, vital signs are as noted. Ears normal.  Throat and pharynx normal.  Neck supple. No adenopathy in the neck. Nose is congested. Frontal sinuses TTP bilaterally. The chest is clear, without wheezes or rales.  ASSESSMENT:  sinusitis  PLAN: Given duration and progression of symptoms, will treat for bacterial sinusitis. Symptomatic therapy suggested: push fluids, rest and return office visit prn if symptoms persist or worsen.  Call or return to clinic prn if these symptoms worsen or fail to improve as anticipated.

## 2011-07-09 NOTE — Patient Instructions (Signed)
Take antibiotic as directed.  Drink lots of fluids.    Treat sympotmatically with Mucinex, nasal saline irrigation, and Tylenol/Ibuprofen.   You can use warm compresses.  Cough suppressant at night.   Call if not improving as expected in 5-7 days.    

## 2011-07-10 LAB — TSH: TSH: 7.31 u[IU]/mL — ABNORMAL HIGH (ref 0.35–5.50)

## 2011-07-13 ENCOUNTER — Telehealth: Payer: Self-pay | Admitting: *Deleted

## 2011-07-13 NOTE — Telephone Encounter (Signed)
Patient called and I advised her of her most recent lab results.  I faxed a copy of her labs to her at [phone].  Patient will give them to her Endocrinologist for review.

## 2011-09-02 ENCOUNTER — Other Ambulatory Visit: Payer: Self-pay

## 2011-09-02 LAB — HCG, QUANTITATIVE, PREGNANCY: Beta Hcg, Quant.: <1 m[IU]/mL — ABNORMAL LOW

## 2011-10-22 ENCOUNTER — Other Ambulatory Visit: Payer: Self-pay | Admitting: Endocrinology

## 2011-10-22 LAB — BASIC METABOLIC PANEL
Anion Gap: 11 (ref 7–16)
BUN: 17 mg/dL (ref 7–18)
Chloride: 102 mmol/L (ref 98–107)
Co2: 29 mmol/L (ref 21–32)
Creatinine: 0.67 mg/dL (ref 0.60–1.30)
EGFR (Non-African Amer.): >60
Glucose: 85 mg/dL (ref 65–99)
Potassium: 3.9 mmol/L (ref 3.5–5.1)

## 2011-12-02 ENCOUNTER — Other Ambulatory Visit: Payer: Self-pay

## 2011-12-02 LAB — HCG, QUANTITATIVE, PREGNANCY: Beta Hcg, Quant.: <1 m[IU]/mL — ABNORMAL LOW

## 2011-12-02 LAB — TSH: Thyroid Stimulating Horm: 0.826 u[IU]/mL

## 2012-01-26 ENCOUNTER — Other Ambulatory Visit: Payer: Self-pay | Admitting: Endocrinology

## 2012-01-26 ENCOUNTER — Other Ambulatory Visit: Payer: Self-pay | Admitting: Obstetrics and Gynecology

## 2012-01-26 LAB — TSH: Thyroid Stimulating Horm: 0.647 u[IU]/mL

## 2012-01-31 ENCOUNTER — Other Ambulatory Visit: Payer: Self-pay | Admitting: Obstetrics and Gynecology

## 2012-02-06 ENCOUNTER — Other Ambulatory Visit: Payer: Self-pay | Admitting: Obstetrics and Gynecology

## 2012-03-09 ENCOUNTER — Other Ambulatory Visit: Payer: Self-pay | Admitting: Endocrinology

## 2012-03-09 LAB — TSH: Thyroid Stimulating Horm: 11.9 u[IU]/mL — ABNORMAL HIGH

## 2012-08-03 ENCOUNTER — Other Ambulatory Visit: Payer: Self-pay

## 2012-08-03 LAB — T4, FREE: Free Thyroxine: 1.09 ng/dL (ref 0.76–1.46)

## 2012-08-03 LAB — TSH: Thyroid Stimulating Horm: 0.533 u[IU]/mL

## 2012-12-12 ENCOUNTER — Telehealth: Payer: Self-pay | Admitting: Family Medicine

## 2012-12-12 NOTE — Telephone Encounter (Signed)
Patient Information:  Caller Name: Merla  Phone: [phone]  Patient: Lindsey Johnson, Lindsey Johnson  Gender: Female  DOB: 1981/03/09  Age: 32 Years  PCP: Kerby Nora or Ruthe Mannan [facility])  Pregnant: No  Office Follow Up:  Does the office need to follow up with this patient?: No  Instructions For The Office: N/A  RN Note:  LMP before pregnancy; NVD 09/03/12 at 34 weeks. Lactating. Vomited X 3 since 0600; last emesis  at 0930. Suggested to switch to Tylenol for next dose for fever management.  Hydration measures reviewed.  Symptoms  Reason For Call & Symptoms: Vomiting with fever.  Reviewed Health History In EMR: Yes  Reviewed Medications In EMR: Yes  Reviewed Allergies In EMR: Yes  Reviewed Surgeries / Procedures: Yes  Date of Onset of Symptoms: 12/12/2012  Treatments Tried: Motrin, fluids, rest  Treatments Tried Worked: Yes  Any Fever: Yes  Fever Taken: Oral  Fever Time Of Reading: 11:45:00  Fever Last Reading: 102.1 OB / GYN:  LMP: Unknown  Guideline(s) Used:  Vomiting  Disposition Per Guideline:   Home Care  Reason For Disposition Reached:   Vomiting  Advice Given:  Reassurance:  Vomiting can be caused by many types of illnesses. It can be caused by a stomach flu virus. It can be caused by eating or drinking something that disagreed with your stomach.  Adults with vomiting need to stay hydrated. This is the most important thing. If you don't drink and replace lost fluids, you may get dehydrated.  You can treat vomiting, even if there is mild dehydration, at home.  Clear Liquids:  Sip water or a rehydration drink (e.g., Gatorade or Powerade).  Other options: 1/2 strength flat lemon-lime soda or ginger ale.  After 4 hours without vomiting, increase the amount.  For Non-stop Vomiting, Try Sleeping:  Try to go to sleep (Reason: sleep often empties the stomach and relieves the need to vomit).  When you awaken, resume drinking liquids. Water works best initially.  Avoid  Nonprescription Medicines:  Stop all nonprescription medicines for 24 hours (Reason: they may make vomiting worse).  Contagiousness:  You can return to work or school after vomiting and fever are gone.  Expected Course:  Vomiting from viral gastritis usually stops in 12 to 48 hours.  If diarrhea is present, it usually lasts for several days.  People with mild dehydration can usually treat themselves at home, by drinking more liquids.  People with moderate to severe dehydration may need medical care. signs of this include very dry mouth, dizziness, weakness, and decreased urination.  Patient Will Follow Care Advice:  YES

## 2013-03-20 ENCOUNTER — Other Ambulatory Visit (INDEPENDENT_AMBULATORY_CARE_PROVIDER_SITE_OTHER): Payer: 59

## 2013-03-20 ENCOUNTER — Telehealth: Payer: Self-pay | Admitting: Family Medicine

## 2013-03-20 DIAGNOSIS — E871 Hypo-osmolality and hyponatremia: Secondary | ICD-10-CM

## 2013-03-20 DIAGNOSIS — Z13220 Encounter for screening for lipoid disorders: Secondary | ICD-10-CM

## 2013-03-20 LAB — COMPREHENSIVE METABOLIC PANEL
AST: 21 U/L (ref 0–37)
Albumin: 4.3 g/dL (ref 3.5–5.2)
Alkaline Phosphatase: 70 U/L (ref 39–117)
Potassium: 3.9 mEq/L (ref 3.5–5.1)
Sodium: 138 mEq/L (ref 135–145)
Total Bilirubin: 0.6 mg/dL (ref 0.3–1.2)
Total Protein: 7.7 g/dL (ref 6.0–8.3)

## 2013-03-20 LAB — LIPID PANEL
Total CHOL/HDL Ratio: 2
Triglycerides: 56 mg/dL (ref 0.0–149.0)
VLDL: 11.2 mg/dL (ref 0.0–40.0)

## 2013-03-20 LAB — LDL CHOLESTEROL, DIRECT: Direct LDL: 127.7 mg/dL

## 2013-03-20 NOTE — Telephone Encounter (Signed)
Message copied by Excell Seltzer on Mon Mar 20, 2013  2:05 AM ------      Message from: Alvina Chou      Created: Tue Mar 14, 2013 11:03 AM      Regarding: Lab orders for 7.21.14       Patient is scheduled for CPX labs, please order future labs, Thanks , Terri       ------

## 2013-03-23 ENCOUNTER — Encounter: Payer: Self-pay | Admitting: Family Medicine

## 2013-03-23 ENCOUNTER — Ambulatory Visit (INDEPENDENT_AMBULATORY_CARE_PROVIDER_SITE_OTHER): Payer: 59 | Admitting: Family Medicine

## 2013-03-23 VITALS — BP 120/72 | HR 69 | Temp 98.3°F | Ht 66.0 in | Wt 168.5 lb

## 2013-03-23 DIAGNOSIS — E039 Hypothyroidism, unspecified: Secondary | ICD-10-CM

## 2013-03-23 DIAGNOSIS — Z0000 Encounter for general adult medical examination without abnormal findings: Secondary | ICD-10-CM

## 2013-03-23 LAB — POCT URINALYSIS DIPSTICK
Bilirubin, UA: NEGATIVE
Blood, UA: NEGATIVE
Glucose, UA: NEGATIVE
Ketones, UA: NEGATIVE
Leukocytes, UA: NEGATIVE
Nitrite, UA: NEGATIVE

## 2013-03-23 MED ORDER — LEVOTHYROXINE SODIUM 137 MCG PO TABS: 137.0000 ug | ORAL_TABLET | Freq: Every day | ORAL | Status: DC

## 2013-03-23 NOTE — Progress Notes (Signed)
  Subjective:    Patient ID: Lindsey Johnson, female    DOB: [birth year], 32 y.o.   MRN: [id number]  HPI . The patient is here for administrative physical  She had a baby in and has pelvic and breast with OB at 6 week post delivery check.  Hypothyroidism Hx of thyroid nodule.  Current symptoms: none . Patient denies change in energy level, diarrhea, heat / cold intolerance, nervousness, palpitations and weight changes. Symptoms have been well-controlled.  She is due for re-eval. She has not been taking medication regularly. 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  Has a 66 month old.     Review of Systems  Constitutional: Negative for fever and fatigue.  HENT: Negative for ear pain.   Eyes: Negative for pain.  Respiratory: Negative for chest tightness and shortness of breath.   Cardiovascular: Negative for chest pain, palpitations and leg swelling.  Gastrointestinal: Negative for abdominal pain.  Genitourinary: Negative for dysuria.       Objective:   Physical Exam  Constitutional: Vital signs are normal. She appears well-developed and well-nourished. She is cooperative.  Non-toxic appearance. She does not appear ill. No distress.  HENT:  Head: Normocephalic.  Right Ear: Hearing, tympanic membrane, external ear and ear canal normal.  Left Ear: Hearing, tympanic membrane, external ear and ear canal normal.  Nose: Nose normal.  Eyes: Conjunctivae, EOM and lids are normal. Pupils are equal, round, and reactive to light. No foreign bodies found.  Neck: Trachea normal and normal range of motion. Neck supple. Carotid bruit is not present. No mass and no thyromegaly present.  Cardiovascular: Normal rate, regular rhythm, S1 normal, S2 normal, normal heart sounds and intact distal pulses.  Exam reveals no gallop.   No murmur heard. Pulmonary/Chest: Effort normal and breath sounds normal. No respiratory distress. She has  no wheezes. She has no rhonchi. She has no rales.  Abdominal: Soft. Normal appearance and bowel sounds are normal. She exhibits no distension, no fluid wave, no abdominal bruit and no mass. There is no hepatosplenomegaly. There is no tenderness. There is no rebound, no guarding and no CVA tenderness. No hernia.  Lymphadenopathy:    She has no cervical adenopathy.    She has no axillary adenopathy.  Neurological: She is alert. She has normal strength. No cranial nerve deficit or sensory deficit.  Skin: Skin is warm, dry and intact. No rash noted.  Psychiatric: Her speech is normal and behavior is normal. Judgment normal. Her mood appears not anxious. Cognition and memory are normal. She does not exhibit a depressed mood.          Assessment & Plan:

## 2013-03-23 NOTE — Patient Instructions (Signed)
Take thyroid medication regualrly... Follow up for labs in 3 weeks.

## 2013-03-23 NOTE — Assessment & Plan Note (Signed)
Not taking med regualrly. Restart and recheck in 3 weeks.

## 2013-07-03 ENCOUNTER — Other Ambulatory Visit: Payer: Self-pay

## 2013-07-03 NOTE — Telephone Encounter (Signed)
Pt left v/m and was seen 03/23/13 and was to take med regularly and then to return in 3 weeks to ck TSH. Pt still is not taking med regularly but is out of levothyroxine and request refill to [facility] outpt pharmacy and will come in later for labs.Please advise.

## 2013-07-05 MED ORDER — LEVOTHYROXINE SODIUM 137 MCG PO TABS: 137.0000 ug | ORAL_TABLET | Freq: Every day | ORAL | Status: DC

## 2013-07-05 NOTE — Telephone Encounter (Signed)
[name] notified refill has been sent to her pharmacy.  Advised to start taking thyroid medication daily and come in for labs to recheck thyroid level in 3 to 4 weeks.  Notified no more refills will be given until labs have been done.  Patient states understanding.

## 2013-07-05 NOTE — Telephone Encounter (Signed)
Left message for Stephene to return my call

## 2013-07-05 NOTE — Telephone Encounter (Signed)
Let pt know we need to determine if we are treating her appropriately... She needs to take med regualrly then follow up in 3-4 eweks for recheck... No further refills until she does this after this one.

## 2013-07-06 ENCOUNTER — Other Ambulatory Visit: Payer: Self-pay

## 2013-08-18 ENCOUNTER — Ambulatory Visit: Payer: Self-pay | Admitting: Physician Assistant

## 2013-08-18 LAB — URINALYSIS, COMPLETE
Glucose,UR: NEGATIVE mg/dL (ref 0–75)
Nitrite: NEGATIVE
Specific Gravity: 1.025 (ref 1.003–1.030)

## 2014-01-12 ENCOUNTER — Ambulatory Visit (INDEPENDENT_AMBULATORY_CARE_PROVIDER_SITE_OTHER): Payer: 59 | Admitting: Internal Medicine

## 2014-01-12 ENCOUNTER — Encounter: Payer: Self-pay | Admitting: Internal Medicine

## 2014-01-12 VITALS — BP 88/58 | HR 79 | Temp 98.4°F | Wt 178.8 lb

## 2014-01-12 DIAGNOSIS — J019 Acute sinusitis, unspecified: Secondary | ICD-10-CM

## 2014-01-12 MED ORDER — AMOXICILLIN 500 MG PO CAPS: 500.0000 mg | ORAL_CAPSULE | Freq: Three times a day (TID) | ORAL | Status: DC

## 2014-01-12 NOTE — Progress Notes (Signed)
Pre visit review using our clinic review tool, if applicable. No additional management support is needed unless otherwise documented below in the visit note. 

## 2014-01-12 NOTE — Patient Instructions (Addendum)

## 2014-01-12 NOTE — Progress Notes (Signed)
HPI  Pt presents to the clinic today with c/o headache, facial pain and pressure, cough and body aches. She reports this started 1 week ago. She is blowing thick yellow mucous out of her nose. The cough is mostly nonproductive. She denies fever or chills. She has had sick contacts. She does have a history of allergies. She does not smoke.  Review of Systems    Past Medical History  Diagnosis Date  . Allergy   . GERD (gastroesophageal reflux disease)   . Other malaise and fatigue   . Hyposmolality and/or hyponatremia   . Premenstrual tension syndromes   . Scanty or infrequent menstruation   . Screening for lipoid disorders   . Nontoxic uninodular goiter   . Goiter, unspecified   . Hashimoto's thyroiditis   . Hives     and history of lip edema but no other airway symptoms. last symptoms in 2010    Family History  Problem Relation Age of Onset  . Arthritis Father     History   Social History  . Marital Status: Married    Spouse Name: N/A    Number of Children: N/A  . Years of Education: N/A   Occupational History  . 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 Smoking status: Never Smoker   . Smokeless tobacco: Never Used  . Alcohol Use: Yes     Comment: 1 glass 2-3 times per week.  . Drug Use: No  . Sexual Activity: Not on file   Other Topics Concern  . Not on file   Social History Narrative   No regular exercise.   Diet:  Fruits, vegetables, water, cheese, minimal milk.    No Known Allergies   Constitutional: Positive headache, fatigue. Denies fever or abrupt weight changes.  HEENT:  Positive eye pain, pressure behind the eyes, facial pain, nasal congestion and sore throat. Denies eye redness, ear pain, ringing in the ears, wax buildup, runny nose or bloody nose. Respiratory: Positive cough. Denies difficulty breathing or shortness of breath.  Cardiovascular: Denies chest pain, chest  tightness, palpitations or swelling in the hands or feet.   No other specific complaints in a complete review of systems (except as listed in HPI above).  Objective:  BP 88/58  Pulse 79  Temp(Src) 98.4 F (36.9 C) (Oral)  Wt 178 lb 12 oz (81.08 kg)  SpO2 98%   General: Appears her stated age, well developed, well nourished in NAD. HEENT: Head: normal shape and size, maxillary and frontal sinus tenderness noted; Eyes: sclera white, no icterus, conjunctiva pink, PERRLA and EOMs intact; Ears: Tm's gray and intact, normal light reflex; Nose: mucosa pink and moist, septum midline; Throat/Mouth: + PND. Teeth present, mucosa pink and moist, no exudate noted, no lesions or ulcerations noted.  Neck: Neck supple, trachea midline. No massses, lumps or thyromegaly present.  Cardiovascular: Normal rate and rhythm. S1,S2 noted.  No murmur, rubs or gallops noted. No JVD or BLE edema. No carotid bruits noted. Pulmonary/Chest: Normal effort and positive vesicular breath sounds. No respiratory distress. No wheezes, rales or ronchi noted.      Assessment & Plan:   Acute bacterial sinusitis  Can use a Neti Pot which can be purchased from your local drug store. Amoxil TID for 10 days Work note provided (to return to work 01/14/14)  RTC as needed or if symptoms persist.

## 2014-06-29 ENCOUNTER — Ambulatory Visit (INDEPENDENT_AMBULATORY_CARE_PROVIDER_SITE_OTHER): Payer: 59 | Admitting: Family Medicine

## 2014-06-29 ENCOUNTER — Encounter: Payer: Self-pay | Admitting: Family Medicine

## 2014-06-29 VITALS — BP 100/72 | HR 72 | Temp 98.2°F | Ht 66.0 in | Wt 186.0 lb

## 2014-06-29 DIAGNOSIS — M7662 Achilles tendinitis, left leg: Secondary | ICD-10-CM | POA: Insufficient documentation

## 2014-06-29 DIAGNOSIS — J018 Other acute sinusitis: Secondary | ICD-10-CM

## 2014-06-29 DIAGNOSIS — J019 Acute sinusitis, unspecified: Secondary | ICD-10-CM | POA: Insufficient documentation

## 2014-06-29 MED ORDER — AMOXICILLIN 500 MG PO CAPS: 1000.0000 mg | ORAL_CAPSULE | Freq: Two times a day (BID) | ORAL | Status: DC

## 2014-06-29 NOTE — Assessment & Plan Note (Signed)
NSAIDs, ICe, home PT, info given.

## 2014-06-29 NOTE — Patient Instructions (Signed)
Start ibuprofen 800 mg every 8 hours for pain.  Start home PT. If not improving call for referral to sports med etc.  Start nasal saline irrigation or spray. Mucinex DM twice daily. If not improving in 3-4 days can fill rx for antibiotics.

## 2014-06-29 NOTE — Assessment & Plan Note (Signed)
Mucolytic, nasal saline. If not improving fill antibiotics.

## 2014-06-29 NOTE — Progress Notes (Signed)
Subjective:    Patient ID: Lindsey Johnson, female    DOB: 1981/08/28, 33 y.o.   MRN: [id number]  Sore Throat  Associated symptoms include congestion, coughing, ear pain, a hoarse voice and swollen glands. Pertinent negatives include no shortness of breath.  Cough The cough is productive of sputum. Associated symptoms include ear pain and a sore throat. Pertinent negatives include no chills, shortness of breath or wheezing. Risk factors: nonsmoker. There is no history of asthma, COPD, emphysema or environmental allergies.  Sinusitis This is a new problem. The current episode started in the past 7 days. The problem has been gradually worsening since onset. There has been no fever. The pain is moderate. Associated symptoms include congestion, coughing, ear pain, a hoarse voice, sinus pressure, a sore throat and swollen glands. Pertinent negatives include no chills or shortness of breath. (Right ear pain) Past treatments include acetaminophen and oral decongestants. The treatment provided no relief.    Also she has pain in her left heel ongoing x 6 months. Pain at rest, but worse when on feet a long time. Has not taken any med for it. Has tried inserts in shoes. No injury, no fall.  No swelling.  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 No running.     Review of Systems  Constitutional: Negative for chills.  HENT: Positive for congestion, ear pain, hoarse voice, sinus pressure and sore throat.   Respiratory: Positive for cough. Negative for shortness of breath and wheezing.   Allergic/Immunologic: Negative for environmental allergies.       Objective:   Physical Exam  Constitutional: Vital signs are normal. She appears well-developed and well-nourished. She is cooperative.  Non-toxic appearance. She does not appear ill. No distress.  HENT:  Head: Normocephalic.  Right Ear: Hearing, tympanic membrane, external ear and ear canal normal. Tympanic membrane is not  erythematous, not retracted and not bulging.  Left Ear: Hearing, tympanic membrane, external ear and ear canal normal. Tympanic membrane is not erythematous, not retracted and not bulging.  Nose: Mucosal edema and rhinorrhea present. Right sinus exhibits maxillary sinus tenderness. Right sinus exhibits no frontal sinus tenderness. Left sinus exhibits maxillary sinus tenderness. Left sinus exhibits no frontal sinus tenderness.  Mouth/Throat: Uvula is midline, oropharynx is clear and moist and mucous membranes are normal. No oropharyngeal exudate, posterior oropharyngeal edema or posterior oropharyngeal erythema.  Eyes: Conjunctivae, EOM and lids are normal. Pupils are equal, round, and reactive to light. Lids are everted and swept, no foreign bodies found.  Neck: Trachea normal and normal range of motion. Neck supple. Carotid bruit is not present. No mass and no thyromegaly present.  Cardiovascular: Normal rate, regular rhythm, S1 normal, S2 normal, normal heart sounds, intact distal pulses and normal pulses.  Exam reveals no gallop and no friction rub.   No murmur heard. Pulmonary/Chest: Effort normal and breath sounds normal. Not tachypneic. No respiratory distress. She has no decreased breath sounds. She has no wheezes. She has no rhonchi. She has no rales.  Musculoskeletal:  Left ankle ttp over achilles insertion, pain with dorsiflexion at ankle Non tender with heel squeeze or at insertion of plantar fascia.  Neurological: She is alert.  Skin: Skin is warm, dry and intact. No rash noted.  Psychiatric: Her speech is normal and behavior is normal. Judgment normal. Her mood appears not anxious. Cognition and memory are normal. She does not exhibit a depressed mood.  Assessment & Plan:

## 2014-06-29 NOTE — Progress Notes (Signed)
Pre visit review using our clinic review tool, if applicable. No additional management support is needed unless otherwise documented below in the visit note. 

## 2014-07-02 ENCOUNTER — Encounter: Payer: Self-pay | Admitting: Family Medicine

## 2014-08-31 NOTE — L&D Delivery Note (Signed)
Delivery Note At 10:23 AM a healthy female was delivered via Vaginal, Spontaneous Delivery (Presentation: Occiput anterior ).  1 loose Drexel.  APGAR: 8, 9; weight  pending.   Placenta status: Intact, Spontaneous.  Cord: 3 vessels with the following complications: None.  Cord pH: N/A  Anesthesia: Local  Episiotomy: None Lacerations: 1st degree perineal, Excision of scar tissue from previous perineal tear. Suture Repair: 3.0 vicryl rapide Est. Blood Loss (mL):  300  Mom to postpartum.  Baby to Couplet care / Skin to Skin. Informed consent for circumcision obtained.  Alaiya Martindelcampo,MARIE-LYNE 06/15/2015, 10:55 AM

## 2014-10-26 ENCOUNTER — Other Ambulatory Visit (HOSPITAL_COMMUNITY)
Admission: AD | Admit: 2014-10-26 | Discharge: 2014-10-26 | Disposition: A | Discharge status: Home / Self Care | Payer: 59 | Source: Ambulatory Visit | Attending: Obstetrics and Gynecology | Admitting: Obstetrics and Gynecology

## 2014-10-26 DIAGNOSIS — Z3200 Encounter for pregnancy test, result unknown: Secondary | ICD-10-CM | POA: Diagnosis not present

## 2014-10-26 LAB — HCG, QUANTITATIVE, PREGNANCY: hCG, Beta Chain, Quant, S: 64 m[IU]/mL — ABNORMAL HIGH (ref <5)

## 2014-10-26 LAB — HCG, SERUM, QUALITATIVE: Preg, Serum: POSITIVE — AB

## 2014-11-27 ENCOUNTER — Other Ambulatory Visit: Payer: Self-pay | Admitting: Obstetrics and Gynecology

## 2014-12-04 LAB — OB RESULTS CONSOLE ANTIBODY SCREEN: ANTIBODY SCREEN: NEGATIVE

## 2014-12-04 LAB — OB RESULTS CONSOLE RPR: RPR: NONREACTIVE

## 2014-12-04 LAB — OB RESULTS CONSOLE HEPATITIS B SURFACE ANTIGEN: HEP B S AG: NEGATIVE

## 2014-12-04 LAB — OB RESULTS CONSOLE HIV ANTIBODY (ROUTINE TESTING): HIV: NONREACTIVE

## 2014-12-04 LAB — OB RESULTS CONSOLE RUBELLA ANTIBODY, IGM: Rubella: IMMUNE

## 2014-12-04 LAB — OB RESULTS CONSOLE ABO/RH: RH TYPE: POSITIVE

## 2014-12-10 LAB — OB RESULTS CONSOLE GC/CHLAMYDIA
Chlamydia: NEGATIVE
Gonorrhea: NEGATIVE

## 2014-12-17 ENCOUNTER — Encounter (HOSPITAL_COMMUNITY): Payer: Self-pay

## 2014-12-17 ENCOUNTER — Encounter (HOSPITAL_COMMUNITY)
Admission: RE | Admit: 2014-12-17 | Discharge: 2014-12-17 | Disposition: A | Discharge status: Home / Self Care | Payer: 59 | Source: Ambulatory Visit | Attending: Obstetrics and Gynecology | Admitting: Obstetrics and Gynecology

## 2014-12-17 DIAGNOSIS — Z01818 Encounter for other preprocedural examination: Secondary | ICD-10-CM | POA: Diagnosis present

## 2014-12-17 LAB — CBC
HEMATOCRIT: 34.3 % — AB (ref 36.0–46.0)
HEMOGLOBIN: 11.3 g/dL — AB (ref 12.0–15.0)
MCH: 30 pg (ref 26.0–34.0)
MCHC: 32.9 g/dL (ref 30.0–36.0)
MCV: 91 fL (ref 78.0–100.0)
Platelets: 160 10*3/uL (ref 150–400)
RBC: 3.77 MIL/uL — ABNORMAL LOW (ref 3.87–5.11)
RDW: 13.6 % (ref 11.5–15.5)
WBC: 6.1 10*3/uL (ref 4.0–10.5)

## 2014-12-17 NOTE — Patient Instructions (Addendum)
   Your procedure is scheduled on: April 26 at 1045  [facility] at: 915am  Pick up the phone at the desk and dial [phone] and inform us of your arrival.  Please call this number if you have any problems the morning of surgery: [phone]  Remember: Do not eat food after midnight: April 25 Do not drink clear liquids after: 630am Take these medicines the morning of surgery with a SIP OF WATER:  Do not wear jewelry, make-up, or FINGER nail polish No metal in your hair or on your body. Do not wear lotions, powders, perfumes.  You may wear deodorant.  Do not bring valuables to the hospital. Contacts, dentures or bridgework may not be worn into surgery.     Patients discharged on the day of surgery will not be allowed to drive home.

## 2014-12-24 NOTE — Anesthesia Preprocedure Evaluation (Addendum)
Anesthesia Evaluation  Patient identified by MRN, date of birth, ID band Patient awake    Reviewed: Allergy & Precautions, NPO status , Patient's Chart, lab work & pertinent test results, reviewed documented beta blocker date and time   Airway Mallampati: II   Neck ROM: Full    Dental  (+) Teeth Intact, Dental Advisory Given   Pulmonary neg pulmonary ROS,  breath sounds clear to auscultation        Cardiovascular negative cardio ROS  Rhythm:Regular     Neuro/Psych Depression    GI/Hepatic Neg liver ROS, GERD-  Medicated,  Endo/Other    Renal/GU negative Renal ROS     Musculoskeletal   Abdominal (+)  Abdomen: soft.    Peds  Hematology   Anesthesia Other Findings HX of L  vocal cord paralysis  Reproductive/Obstetrics                           Anesthesia Physical Anesthesia Plan  ASA: II  Anesthesia Plan: Spinal   Post-op Pain Management:    Induction:   Airway Management Planned: Natural Airway  Additional Equipment:   Intra-op Plan:   Post-operative Plan:   Informed Consent: I have reviewed the patients History and Physical, chart, labs and discussed the procedure including the risks, benefits and alternatives for the proposed anesthesia with the patient or authorized representative who has indicated his/her understanding and acceptance.     Plan Discussed with:   Anesthesia Plan Comments:         Anesthesia Quick Evaluation

## 2014-12-24 NOTE — H&P (Signed)
NAMMaryan Char:  Johnson, Lindsey                  ACCOUNT NO.:  [id number]  MEDICAL RECORD NO.:  [id number]  LOCATION:  [facility]                         FACILITY:  [facility]  PHYSICIAN:  Lenoard Adenichard J. Wilmarie Sparlin, M.D.DATE OF BIRTH:  08/21/1981  DATE OF ADMISSION:  11/20/2014 DATE OF DISCHARGE:                             HISTORY & PHYSICAL   CHIEF COMPLAINT:  Cervical insufficiency for elective cerclage.  HISTORY OF PRESENT ILLNESS:  A 34 year old white female, G3, P1, history of preterm birth, history of cervical insufficiency, history of a LEEP for elective, history indicated McDonald cerclage.  ALLERGIES:  She has no known drug allergies.  MEDICATIONS ARE:  Thyroid replacement therapy and prenatal vitamins.  FAMILY HISTORY:  Kidney stones, hypertension, cerebrovascular accident, myocardial infarction, breast and lung cancer.  SOCIAL HISTORY:  She is a nonsmoker, nondrinker.  She denies domestic or physical violence.  She has a history of spontaneous pregnancy loss and preterm birth at 34 weeks complicated by cervical insufficiency.  SURGICAL HISTORY:  Remarkable for D and E, thyroidectomy, tonsillectomy and LEEP.  PHYSICAL EXAMINATION:  GENERAL:  A well-developed, well-nourished, white female, in no acute distress. HEENT:  Normal. NECK:  Supple.  Full range of motion. LUNGS:  Clear. HEART:  Regular rate and rhythm. ABDOMEN:  Soft, nontender.  Gravid, 12-weeks, fetal heart tones heard. EXTREMITIES:  There are no cords. NEUROLOGIC:  Nonfocal. SKIN:  Intact.  IMPRESSION: 1. A 12-week and 2-day intrauterine pregnancy. 2. History of preterm birth with cervical insufficiency. 3. History of LEEP.  PLAN:  Proceed with McDonald cervical cerclage.  Risks of anesthesia, infection, bleeding, injury to surrounding organs, possible need for repair was discussed.  Small risk of membrane rupture less than and 2% noted.  The patient acknowledges and wishes to proceed.     Lenoard Adenichard J. Karrina Lye,  M.D.     RJT/MEDQ  D:  12/24/2014  T:  12/24/2014  Job:  [id number]

## 2014-12-25 ENCOUNTER — Ambulatory Visit (HOSPITAL_COMMUNITY)
Admission: RE | Admit: 2014-12-25 | Discharge: 2014-12-25 | Disposition: A | Discharge status: Home / Self Care | Payer: 59 | Source: Ambulatory Visit | Attending: Obstetrics and Gynecology | Admitting: Obstetrics and Gynecology

## 2014-12-25 ENCOUNTER — Ambulatory Visit (HOSPITAL_COMMUNITY): Payer: 59 | Admitting: Anesthesiology

## 2014-12-25 ENCOUNTER — Encounter (HOSPITAL_COMMUNITY): Payer: Self-pay

## 2014-12-25 ENCOUNTER — Encounter (HOSPITAL_COMMUNITY)
Admission: RE | Disposition: A | Discharge status: Home / Self Care | Payer: Self-pay | Source: Ambulatory Visit | Attending: Obstetrics and Gynecology

## 2014-12-25 DIAGNOSIS — Z79890 Hormone replacement therapy (postmenopausal): Secondary | ICD-10-CM | POA: Diagnosis not present

## 2014-12-25 DIAGNOSIS — O3431 Maternal care for cervical incompetence, first trimester: Secondary | ICD-10-CM | POA: Insufficient documentation

## 2014-12-25 DIAGNOSIS — Z9889 Other specified postprocedural states: Secondary | ICD-10-CM | POA: Insufficient documentation

## 2014-12-25 DIAGNOSIS — K219 Gastro-esophageal reflux disease without esophagitis: Secondary | ICD-10-CM | POA: Diagnosis not present

## 2014-12-25 DIAGNOSIS — Z3A12 12 weeks gestation of pregnancy: Secondary | ICD-10-CM | POA: Insufficient documentation

## 2014-12-25 DIAGNOSIS — O99611 Diseases of the digestive system complicating pregnancy, first trimester: Secondary | ICD-10-CM | POA: Diagnosis not present

## 2014-12-25 HISTORY — DX: Other specified postprocedural states: R11.2

## 2014-12-25 HISTORY — PX: CERVICAL CERCLAGE: SHX1329

## 2014-12-25 HISTORY — DX: Other specified postprocedural states: Z98.890

## 2014-12-25 SURGERY — CERCLAGE, CERVIX, VAGINAL APPROACH
Anesthesia: Spinal | Site: Cervix

## 2014-12-25 MED ORDER — INDOMETHACIN 25 MG PO CAPS
50.0000 mg | ORAL_CAPSULE | Freq: Once | ORAL | Status: AC
  Administered 2014-12-25: 50 mg via ORAL
  Filled 2014-12-25: qty 2

## 2014-12-25 MED ORDER — CEFAZOLIN SODIUM-DEXTROSE 2-3 GM-% IV SOLR
INTRAVENOUS | Status: AC
  Filled 2014-12-25: qty 50

## 2014-12-25 MED ORDER — MEPERIDINE HCL 25 MG/ML IJ SOLN: 6.2500 mg | INTRAMUSCULAR | Status: DC | PRN

## 2014-12-25 MED ORDER — INDOMETHACIN 25 MG PO CAPS: 25.0000 mg | ORAL_CAPSULE | Freq: Four times a day (QID) | ORAL | Status: DC

## 2014-12-25 MED ORDER — SCOPOLAMINE 1 MG/3DAYS TD PT72
MEDICATED_PATCH | TRANSDERMAL | Status: AC
  Filled 2014-12-25: qty 1

## 2014-12-25 MED ORDER — CEFAZOLIN SODIUM-DEXTROSE 2-3 GM-% IV SOLR
2.0000 g | INTRAVENOUS | Status: AC
  Administered 2014-12-25: 2 g via INTRAVENOUS

## 2014-12-25 MED ORDER — LACTATED RINGERS IV SOLN: INTRAVENOUS | Status: DC

## 2014-12-25 MED ORDER — BUPIVACAINE IN DEXTROSE 0.75-8.25 % IT SOLN
INTRATHECAL | Status: DC | PRN
  Administered 2014-12-25: 1.33 mL via INTRATHECAL

## 2014-12-25 MED ORDER — LACTATED RINGERS IV SOLN
INTRAVENOUS | Status: DC
  Administered 2014-12-25: 10:00:00 via INTRAVENOUS

## 2014-12-25 MED ORDER — SCOPOLAMINE 1 MG/3DAYS TD PT72: 1.0000 | MEDICATED_PATCH | Freq: Once | TRANSDERMAL | Status: DC

## 2014-12-25 SURGICAL SUPPLY — 23 items
CLOTH BEACON ORANGE TIMEOUT ST (SAFETY) ×3 IMPLANT
COUNTER NEEDLE 1200 MAGNETIC (NEEDLE) IMPLANT
GLOVE BIO SURGEON STRL SZ7.5 (GLOVE) ×3 IMPLANT
GLOVE BIOGEL PI IND STRL 7.0 (GLOVE) ×4 IMPLANT
GLOVE BIOGEL PI INDICATOR 7.0 (GLOVE) ×8
GLOVE ECLIPSE 6.5 STRL STRAW (GLOVE) ×3 IMPLANT
GLOVE SURG SS PI 7.0 STRL IVOR (GLOVE) ×9 IMPLANT
GOWN STRL REUS W/TWL LRG LVL3 (GOWN DISPOSABLE) ×9 IMPLANT
GOWN STRL REUS W/TWL LRG LVL4 (GOWN DISPOSABLE) ×6 IMPLANT
NEEDLE MAYO .5 CIRCLE (NEEDLE) ×3 IMPLANT
NS IRRIG 1000ML POUR BTL (IV SOLUTION) ×3 IMPLANT
PACK VAGINAL MINOR WOMEN LF (CUSTOM PROCEDURE TRAY) ×3 IMPLANT
PAD OB MATERNITY 4.3X12.25 (PERSONAL CARE ITEMS) ×3 IMPLANT
PAD PREP 24X48 CUFFED NSTRL (MISCELLANEOUS) ×3 IMPLANT
SUT ETHIBOND  5 (SUTURE) ×6
SUT ETHIBOND 5 (SUTURE) ×3 IMPLANT
SUT PROLENE 0 CT 1 30 (SUTURE) ×3 IMPLANT
TOWEL OR 17X24 6PK STRL BLUE (TOWEL DISPOSABLE) ×6 IMPLANT
TRAY FOLEY CATH SILVER 14FR (SET/KITS/TRAYS/PACK) ×3 IMPLANT
TUBING NON-CON 1/4 X 20 CONN (TUBING) IMPLANT
TUBING NON-CON 1/4 X 20' CONN (TUBING)
WATER STERILE IRR 1000ML POUR (IV SOLUTION) ×3 IMPLANT
YANKAUER SUCT BULB TIP NO VENT (SUCTIONS) IMPLANT

## 2014-12-25 NOTE — Op Note (Signed)
NAMMaryan Johnson:  [name], [name]                  ACCOUNT NO.:  [id number]  MEDICAL RECORD NO.:  [id number]  LOCATION:  [facility]                          [facility]ACILITY:  [facility]  PHYSICIAN:  Lenoard Adenichard J. Jerico Grisso, M.D.DATE OF BIRTH:  1981/03/26  DATE OF PROCEDURE:  12/25/2014 DATE OF DISCHARGE:  12/25/2014                              OPERATIVE REPORT   PREOPERATIVE DIAGNOSIS:  History of preterm birth, history of LEEP, and history of cervical insufficiency.  POSTOPERATIVE DIAGNOSIS:  History of preterm birth, history of LEEP, and history of cervical insufficiency.  PROCEDURE:  McDonald cervical cerclage.  SURGEON:  Lenoard Adenichard J. Kahner Yanik, M.D.  ASSISTANT:  Fredric MareBailey, [employer].  ESTIMATED BLOOD LOSS:  Less than 50 mL.  COMPLICATIONS:  None.  ANESTHESIA:  Spinal.  DRAINS:  Foley.  COUNTS:  Correct.  DISPOSITION:  The patient to recovery in good condition.  BRIEF OPERATIVE NOTE:  After being apprised of risks of anesthesia, infection, bleeding, injury to surrounding organs, possible need for repair, delayed versus immediate complications to include bowel and bladder injury, possible need for repair, possible rupture of membranes of approximately 2%, the patient was brought to the operating room.  She was administered spinal anesthetic without complications.  Prepped and draped in usual sterile fashion.  A Foley catheter placed.  After achieving adequate anesthesia, exam under anesthesia revealed a shortened but closed cervix.  Fetal heart tones have been documented prior to the procedure.  At this time, using 5 Ethibond suture and a free needle, a cervical cerclage was placed taking suture bites from 1 o'clock to 11 o'clock, 10 o'clock to 8 o'clock, 7 o'clock to 5 o'clock, and 4 o'clock to 1 o'clock.  This cerclage was then tied down over 0 Prolene plain tie which was then tied as well.  Minimal bleeding was noted.  The cerclage was placed in the cervical stroma without difficulty.  No evidence of bleeding or  rupture of membranes was noted.  The patient tolerated the procedure well and was transferred to the recovery in good condition.  Fetal heart tones to be documented postprocedure.     Lenoard Adenichard J. Zade Falkner, M.D.     RJT/MEDQ  D:  12/25/2014  T:  12/25/2014  Job:  [id number]

## 2014-12-25 NOTE — Anesthesia Procedure Notes (Signed)
Spinal Patient location during procedure: OR Start time: 12/25/2014 10:50 AM End time: 12/25/2014 11:00 AM Staffing Anesthesiologist: Sebastian AcheMANNY, Dyllan Hughett Performed by: anesthesiologist  Preanesthetic Checklist Completed: patient identified, site marked, surgical consent, pre-op evaluation, timeout performed, IV checked, risks and benefits discussed and monitors and equipment checked Spinal Block Patient position: sitting Prep: site prepped and draped and DuraPrep Patient monitoring: heart rate, blood pressure and continuous pulse ox Approach: midline Location: L3-4 Injection technique: single-shot Needle Needle type: Pencil-Tip  Needle gauge: 24 G Needle length: 9 cm Needle insertion depth: 5 cm Assessment Sensory level: T6 Additional Notes Marcaine 10mg  with dextrose, no complications, no paresthesias, talked to patient throughout procedure

## 2014-12-25 NOTE — Progress Notes (Signed)
Patient ID: Lindsey Johnson, female   DOB: 1980-12-04, 34 y.o.   MRN: [id number] Patient seen and examined. Consent witnessed and signed. No changes noted. Update completed.

## 2014-12-25 NOTE — Op Note (Signed)
12/25/2014  11:26 AM  PATIENT:  Lindsey Johnson  34 y.o. female  PRE-OPERATIVE DIAGNOSIS:  History of preterm delivery with cervical insufficiency  POST-OPERATIVE DIAGNOSIS:  History of preterm delivery with cervical insufficiency  PROCEDURE:  Procedure(s): CERCLAGE CERVICAL, MCDONALD  SURGEON:  Surgeon(s): Olivia Mackieichard Shriyans Kuenzi, MD  ASSISTANTS: Montez HagemanBailey, FACM, CNM   ANESTHESIA:   spinal  ESTIMATED BLOOD LOSS: minimal  DRAINS: Urinary Catheter (Foley)   LOCAL MEDICATIONS USED:  NONE  SPECIMEN:  No Specimen  DISPOSITION OF SPECIMEN:  N/A  COUNTS:  YES  DICTATION #: [id number]  PLAN OF CARE: dc home  PATIENT DISPOSITION:  PACU - hemodynamically stable.

## 2014-12-25 NOTE — Transfer of Care (Signed)
Immediate Anesthesia Transfer of Care Note  Patient: Lindsey Johnson  Procedure(s) Performed: Procedure(s): CERCLAGE CERVICAL (N/A)  Patient Location: PACU  Anesthesia Type:Spinal  Level of Consciousness: awake, alert  and oriented  Airway & Oxygen Therapy: Patient Spontanous Breathing  Post-op Assessment: Report given to RN and Post -op Vital signs reviewed and stable  Post vital signs: Reviewed and stable  Last Vitals:  Filed Vitals:   12/25/14 0933  Pulse: 75  Temp: 36.5 C  Resp: 20    Complications: No apparent anesthesia complications

## 2014-12-25 NOTE — Anesthesia Postprocedure Evaluation (Signed)
  Anesthesia Post-op Note  Patient: Lindsey Johnson  Procedure(s) Performed: Procedure(s): CERCLAGE CERVICAL (N/A)  Patient Location: PACU  Anesthesia Type:Spinal  Level of Consciousness: awake and alert   Airway and Oxygen Therapy: Patient Spontanous Breathing and Patient connected to nasal cannula oxygen  Post-op Pain: none  Post-op Assessment: Post-op Vital signs reviewed, Patient's Cardiovascular Status Stable, Respiratory Function Stable, Patent Airway and No signs of Nausea or vomiting  Post-op Vital Signs: Reviewed and stable  Last Vitals:  Filed Vitals:   12/25/14 1142  BP:   Pulse: 63  Temp:   Resp: 10    Complications: No apparent anesthesia complications

## 2014-12-26 ENCOUNTER — Encounter (HOSPITAL_COMMUNITY): Payer: Self-pay | Admitting: Obstetrics and Gynecology

## 2015-04-11 ENCOUNTER — Inpatient Hospital Stay (HOSPITAL_COMMUNITY)
Admission: AD | Admit: 2015-04-11 | Discharge: 2015-04-11 | Disposition: A | Discharge status: Home / Self Care | Payer: 59 | Source: Ambulatory Visit | Attending: Obstetrics and Gynecology | Admitting: Obstetrics and Gynecology

## 2015-04-11 ENCOUNTER — Encounter (HOSPITAL_COMMUNITY): Payer: Self-pay

## 2015-04-11 DIAGNOSIS — O09212 Supervision of pregnancy with history of pre-term labor, second trimester: Secondary | ICD-10-CM | POA: Diagnosis not present

## 2015-04-11 DIAGNOSIS — N883 Incompetence of cervix uteri: Secondary | ICD-10-CM | POA: Diagnosis not present

## 2015-04-11 DIAGNOSIS — Z3A27 27 weeks gestation of pregnancy: Secondary | ICD-10-CM | POA: Insufficient documentation

## 2015-04-11 DIAGNOSIS — N949 Unspecified condition associated with female genital organs and menstrual cycle: Secondary | ICD-10-CM | POA: Diagnosis present

## 2015-04-11 DIAGNOSIS — R102 Pelvic and perineal pain: Secondary | ICD-10-CM

## 2015-04-11 DIAGNOSIS — O26892 Other specified pregnancy related conditions, second trimester: Secondary | ICD-10-CM | POA: Diagnosis present

## 2015-04-11 HISTORY — DX: Other specified pregnancy related conditions, second trimester: O26.892

## 2015-04-11 HISTORY — DX: Unspecified condition associated with female genital organs and menstrual cycle: N94.9

## 2015-04-11 HISTORY — DX: Pelvic and perineal pain: R10.2

## 2015-04-11 NOTE — MAU Provider Note (Signed)
History     CSN: [id number]  Arrival date and time: 04/11/15 1916  Provider on unit: 2020 Provider at bedside: 2040    CC: Pelvic Pressure, second trimester HPI  Ms. Lindsey Johnson is 34 yo G2P0101 female at 27.[redacted] wks gestation presenting with complaints of increased low pelvic pressure after straining with a "rather large" BM at 1800. "Very constipated." She has a cerclage.  She reports lots of good FM. Her last CL last week was 2.7 - 3 cm.  She has an appt with Dr. Billy Coast tomorrow. Her prenatal care has been complicated by h/o LEEP affecting pregnancy, cerclage.  Past Medical History  Diagnosis Date  . Allergy   . GERD (gastroesophageal reflux disease)   . Other malaise and fatigue   . Hyposmolality and/or hyponatremia   . Premenstrual tension syndromes   . Scanty or infrequent menstruation   . Screening for lipoid disorders   . Nontoxic uninodular goiter   . Goiter, unspecified   . Hashimoto's thyroiditis   . Hives     and history of lip edema but no other airway symptoms. last symptoms in 2010  . PONV (postoperative nausea and vomiting)   . Feeling pelvic pressure during pregnancy in second trimester, antepartum 04/11/2015    Past Surgical History  Procedure Laterality Date  . Tonsillectomy  2007  . Total thyroidectomy      2012  . Dilation and evacuation  04/17/2011    Procedure: DILATATION AND EVACUATION (D&E);  Surgeon: Melony Overly;  Location: [facility];  Service: Gynecology;  Laterality: N/A;  . Cervical cerclage N/A 12/25/2014    Procedure: CERCLAGE CERVICAL;  Surgeon: Olivia Mackie, MD;  Location: [facility];  Service: Gynecology;  Laterality: N/A;    Family History  Problem Relation Age of Onset  . Arthritis Father     Social History  Substance Use Topics  . Smoking status: Never Smoker   . Smokeless tobacco: Never Used  . Alcohol Use: No     Comment: 1 glass 2-3 times per week.    Allergies: No Known Allergies  Prescriptions prior to admission  Medication  Sig Dispense Refill Last Dose  . calcium carbonate (TUMS - DOSED IN MG ELEMENTAL CALCIUM) 500 MG chewable tablet Chew 2 tablets by mouth 4 (four) times daily as needed for indigestion or heartburn.   04/11/2015 at Unknown time  . hydroxyprogesterone caproate (DELALUTIN) 250 mg/mL OIL injection Inject 250 mg into the muscle once a week. Fridays   04/05/2015  . levothyroxine (SYNTHROID, LEVOTHROID) 200 MCG tablet Take 200 mcg by mouth at bedtime.   04/10/2015 at Unknown time  . Prenatal Vit-Fe Fumarate-FA (PRENATAL MULTIVITAMIN) TABS tablet Take 1 tablet by mouth daily at 12 noon.   04/11/2015 at Unknown time  . amoxicillin (AMOXIL) 500 MG capsule Take 2 capsules (1,000 mg total) by mouth 2 (two) times daily. (Patient not taking: Reported on 12/12/2014) 40 capsule 0 Completed Course at Unknown time  . indomethacin (INDOCIN) 25 MG capsule Take 1 capsule (25 mg total) by mouth every 6 (six) hours. (Patient not taking: Reported on 04/11/2015) 4 capsule 0 Not Taking at Unknown time  . [DISCONTINUED] levothyroxine (SYNTHROID, LEVOTHROID) 137 MCG tablet Take 1 tablet (137 mcg total) by mouth daily before breakfast. (Patient not taking: Reported on 12/12/2014) 90 tablet 0 Not Taking at Unknown time    Review of Systems  Constitutional: Negative.   HENT: Negative.   Eyes: Negative.   Respiratory: Negative.   Cardiovascular: Negative.  Gastrointestinal: Positive for constipation.       Anal numbness  Genitourinary:       Pelvic pressure; (+) FM  Musculoskeletal: Negative.   Skin: Negative.   Neurological: Negative.   Endo/Heme/Allergies: Negative.   Psychiatric/Behavioral: Negative.    CEFM FHR: 150 bpm / moderate variability / accels present / no decels TOCO: 2 mild UCs, but no regular UC's or UI  Physical Exam   Blood pressure 105/73, pulse 84, temperature 98.1 F (36.7 C), temperature source Oral, resp. rate 18, height 5\' 6"  (1.676 m), weight 84.732 kg (186 lb 12.8 oz), last menstrual period  09/11/2014, SpO2 100 %.  Physical Exam  Constitutional: She is oriented to person, place, and time. She appears well-developed and well-nourished.  HENT:  Head: Normocephalic and atraumatic.  Eyes: Pupils are equal, round, and reactive to light.  Neck: Normal range of motion.  GI: Soft.  Genitourinary:  Cx: closed - stable cerclage / stitches intact  Musculoskeletal: Normal range of motion.  Neurological: She is alert and oriented to person, place, and time.  Skin: Skin is warm and dry.  Psychiatric: She has a normal mood and affect. Her behavior is normal. Judgment and thought content normal.    MAU Course  Procedures CEFM  Assessment and Plan  34 yo G2P0101 at 27.[redacted] wks gestation Incompetent Cervix Cerclage H/O preterm birth Category 1 FHR tracing  Discharge home Note to be OOW until Monday 04/15/2015 Ok to take Colace, Miralax or magnesium Oxide 200 mg daily for constipation Call the office for contractions, bleeding, leaking fluid Keep appt tomorrow with Dr. Billy Coast  *Dr. Billy Coast notified of assessment and plan - agrees   Kenard Gower MSN, CNM 04/11/2015, 8:50 PM

## 2015-04-11 NOTE — MAU Note (Signed)
Heaviness after constipated BM, with straining, no bleeding, no cramping

## 2015-04-11 NOTE — MAU Note (Signed)
Pt states that she had a large bowel movement and since then she's had a lot of pressure but it is not painful. Concerned since she had a cerclage placed. Denies contractions, LOF, vag bleeding. +FM.

## 2015-04-11 NOTE — Discharge Instructions (Signed)
Abdominal Pain During Pregnancy °Abdominal pain is common in pregnancy. Most of the time, it does not cause harm. There are many causes of abdominal pain. Some causes are more serious than others. Some of the causes of abdominal pain in pregnancy are easily diagnosed. Occasionally, the diagnosis takes time to understand. Other times, the cause is not determined. Abdominal pain can be a sign that something is very wrong with the pregnancy, or the pain may have nothing to do with the pregnancy at all. For this reason, always tell your health care provider if you have any abdominal discomfort. °HOME CARE INSTRUCTIONS  °Monitor your abdominal pain for any changes. The following actions may help to alleviate any discomfort you are experiencing: °· Do not have sexual intercourse or put anything in your vagina until your symptoms go away completely. °· Get plenty of rest until your pain improves. °· Drink clear fluids if you feel nauseous. Avoid solid food as long as you are uncomfortable or nauseous. °· Only take over-the-counter or prescription medicine as directed by your health care provider. °· Keep all follow-up appointments with your health care provider. °SEEK IMMEDIATE MEDICAL CARE IF: °· You are bleeding, leaking fluid, or passing tissue from the vagina. °· You have increasing pain or cramping. °· You have persistent vomiting. °· You have painful or bloody urination. °· You have a fever. °· You notice a decrease in your baby's movements. °· You have extreme weakness or feel faint. °· You have shortness of breath, with or without abdominal pain. °· You develop a severe headache with abdominal pain. °· You have abnormal vaginal discharge with abdominal pain. °· You have persistent diarrhea. °· You have abdominal pain that continues even after rest, or gets worse. °MAKE SURE YOU:  °· Understand these instructions. °· Will watch your condition. °· Will get help right away if you are not doing well or get  worse. °Document Released: 08/17/2005 Document Revised: 06/07/2013 Document Reviewed: 03/16/2013 °ExitCare® Patient Information ©2015 [employer]. This information is not intended to replace advice given to you by your health care provider. Make sure you discuss any questions you have with your health care provider. ° ° ° °Preterm Labor Information °Preterm labor is when labor starts at less than 37 weeks of pregnancy. The normal length of a pregnancy is 39 to 41 weeks. °CAUSES °Often, there is no identifiable underlying cause as to why a woman goes into preterm labor. One of the most common known causes of preterm labor is infection. Infections of the uterus, cervix, vagina, amniotic sac, bladder, kidney, or even the lungs (pneumonia) can cause labor to start. Other suspected causes of preterm labor include:  °Urogenital infections, such as yeast infections and bacterial vaginosis.   °Uterine abnormalities (uterine shape, uterine septum, fibroids, or bleeding from the placenta).   °A cervix that has been operated on (it may fail to stay closed).   °Malformations in the fetus.   °Multiple gestations (twins, triplets, and so on).   °Breakage of the amniotic sac.   °RISK FACTORS °Having a previous history of preterm labor.   °Having premature rupture of membranes (PROM).   °Having a placenta that covers the opening of the cervix (placenta previa).   °Having a placenta that separates from the uterus (placental abruption).   °Having a cervix that is too weak to hold the fetus in the uterus (incompetent cervix).   °Having too much fluid in the amniotic sac (polyhydramnios).   °Taking illegal drugs or smoking while pregnant.   °Not gaining enough weight while pregnant.   °Being younger   than 18 and older than 32 years old.   °Having a low socioeconomic status.   °Being African American. °SYMPTOMS °Signs and symptoms of preterm labor include:  °Menstrual-like cramps, abdominal pain, or back pain. °Uterine contractions  that are regular, as frequent as six in an hour, regardless of their intensity (may be mild or painful). °Contractions that start on the top of the uterus and spread down to the lower abdomen and back.   °A sense of increased pelvic pressure.   °A watery or bloody mucus discharge that comes from the vagina.   °TREATMENT °Depending on the length of the pregnancy and other circumstances, your health care provider may suggest bed rest. If necessary, there are medicines that can be given to stop contractions and to mature the fetal lungs. If labor happens before 34 weeks of pregnancy, a prolonged hospital stay may be recommended. Treatment depends on the condition of both you and the fetus.  °WHAT SHOULD YOU DO IF YOU THINK YOU ARE IN PRETERM LABOR? °Call your health care provider right away. You will need to go to the hospital to get checked immediately. °HOW CAN YOU PREVENT PRETERM LABOR IN FUTURE PREGNANCIES? °You should:  °Stop smoking if you smoke.  °Maintain healthy weight gain and avoid chemicals and drugs that are not necessary. °Be watchful for any type of infection. °Inform your health care provider if you have a known history of preterm labor. °Document Released: 11/07/2003 Document Revised: 04/19/2013 Document Reviewed: 09/19/2012 °ExitCare® Patient Information ©2015 [employer]. This information is not intended to replace advice given to you by your health care provider. Make sure you discuss any questions you have with your health care provider. ° °

## 2015-06-05 LAB — OB RESULTS CONSOLE GBS: STREP GROUP B AG: NEGATIVE

## 2015-06-12 ENCOUNTER — Inpatient Hospital Stay (HOSPITAL_COMMUNITY)
Admission: AD | Admit: 2015-06-12 | Discharge: 2015-06-12 | Disposition: A | Discharge status: Home / Self Care | Payer: 59 | Source: Ambulatory Visit | Attending: Obstetrics and Gynecology | Admitting: Obstetrics and Gynecology

## 2015-06-12 ENCOUNTER — Encounter (HOSPITAL_COMMUNITY): Payer: Self-pay

## 2015-06-12 DIAGNOSIS — O26892 Other specified pregnancy related conditions, second trimester: Principal | ICD-10-CM

## 2015-06-12 DIAGNOSIS — R102 Pelvic and perineal pain: Secondary | ICD-10-CM

## 2015-06-12 DIAGNOSIS — N949 Unspecified condition associated with female genital organs and menstrual cycle: Secondary | ICD-10-CM

## 2015-06-12 NOTE — Discharge Instructions (Signed)
Fetal Movement Counts Patient Name: __________________________________________________ Patient Due Date: ____________________ Performing a fetal movement count is highly recommended in high-risk pregnancies, but it is good for every pregnant woman to do. Your health care provider may ask you to start counting fetal movements at 28 weeks of the pregnancy. Fetal movements often increase:  After eating a full meal.  After physical activity.  After eating or drinking something sweet or cold.  At rest. Pay attention to when you feel the baby is most active. This will help you notice a pattern of your baby's sleep and wake cycles and what factors contribute to an increase in fetal movement. It is important to perform a fetal movement count at the same time each day when your baby is normally most active.  HOW TO COUNT FETAL MOVEMENTS 1. Find a quiet and comfortable area to sit or lie down on your left side. Lying on your left side provides the best blood and oxygen circulation to your baby. 2. Write down the day and time on a sheet of paper or in a journal. 3. Start counting kicks, flutters, swishes, rolls, or jabs in a 2-hour period. You should feel at least 10 movements within 2 hours. 4. If you do not feel 10 movements in 2 hours, wait 2-3 hours and count again. Look for a change in the pattern or not enough counts in 2 hours. SEEK MEDICAL CARE IF:  You feel less than 10 counts in 2 hours, tried twice.  There is no movement in over an hour.  The pattern is changing or taking longer each day to reach 10 counts in 2 hours.  You feel the baby is not moving as he or she usually does. Date: ____________ Movements: ____________ Start time: ____________ Finish time: ____________  Date: ____________ Movements: ____________ Start time: ____________ Finish time: ____________ Date: ____________ Movements: ____________ Start time: ____________ Finish time: ____________ Date: ____________ Movements:  ____________ Start time: ____________ Finish time: ____________ Date: ____________ Movements: ____________ Start time: ____________ Finish time: ____________ Date: ____________ Movements: ____________ Start time: ____________ Finish time: ____________ Date: ____________ Movements: ____________ Start time: ____________ Finish time: ____________ Date: ____________ Movements: ____________ Start time: ____________ Finish time: ____________  Date: ____________ Movements: ____________ Start time: ____________ Finish time: ____________ Date: ____________ Movements: ____________ Start time: ____________ Finish time: ____________ Date: ____________ Movements: ____________ Start time: ____________ Finish time: ____________ Date: ____________ Movements: ____________ Start time: ____________ Finish time: ____________ Date: ____________ Movements: ____________ Start time: ____________ Finish time: ____________ Date: ____________ Movements: ____________ Start time: ____________ Finish time: ____________ Date: ____________ Movements: ____________ Start time: ____________ Finish time: ____________  Date: ____________ Movements: ____________ Start time: ____________ Finish time: ____________ Date: ____________ Movements: ____________ Start time: ____________ Finish time: ____________ Date: ____________ Movements: ____________ Start time: ____________ Finish time: ____________ Date: ____________ Movements: ____________ Start time: ____________ Finish time: ____________ Date: ____________ Movements: ____________ Start time: ____________ Finish time: ____________ Date: ____________ Movements: ____________ Start time: ____________ Finish time: ____________ Date: ____________ Movements: ____________ Start time: ____________ Finish time: ____________  Date: ____________ Movements: ____________ Start time: ____________ Finish time: ____________ Date: ____________ Movements: ____________ Start time: ____________ Finish  time: ____________ Date: ____________ Movements: ____________ Start time: ____________ Finish time: ____________ Date: ____________ Movements: ____________ Start time: ____________ Finish time: ____________ Date: ____________ Movements: ____________ Start time: ____________ Finish time: ____________ Date: ____________ Movements: ____________ Start time: ____________ Finish time: ____________ Date: ____________ Movements: ____________ Start time: ____________ Finish time: ____________  Date: ____________ Movements: ____________ Start time: ____________ Finish   time: ____________ Date: ____________ Movements: ____________ Start time: ____________ Finish time: ____________ Date: ____________ Movements: ____________ Start time: ____________ Finish time: ____________ Date: ____________ Movements: ____________ Start time: ____________ Finish time: ____________ Date: ____________ Movements: ____________ Start time: ____________ Finish time: ____________ Date: ____________ Movements: ____________ Start time: ____________ Finish time: ____________ Date: ____________ Movements: ____________ Start time: ____________ Finish time: ____________  Date: ____________ Movements: ____________ Start time: ____________ Finish time: ____________ Date: ____________ Movements: ____________ Start time: ____________ Finish time: ____________ Date: ____________ Movements: ____________ Start time: ____________ Finish time: ____________ Date: ____________ Movements: ____________ Start time: ____________ Finish time: ____________ Date: ____________ Movements: ____________ Start time: ____________ Finish time: ____________ Date: ____________ Movements: ____________ Start time: ____________ Finish time: ____________ Date: ____________ Movements: ____________ Start time: ____________ Finish time: ____________  Date: ____________ Movements: ____________ Start time: ____________ Finish time: ____________ Date: ____________  Movements: ____________ Start time: ____________ Finish time: ____________ Date: ____________ Movements: ____________ Start time: ____________ Finish time: ____________ Date: ____________ Movements: ____________ Start time: ____________ Finish time: ____________ Date: ____________ Movements: ____________ Start time: ____________ Finish time: ____________ Date: ____________ Movements: ____________ Start time: ____________ Finish time: ____________ Date: ____________ Movements: ____________ Start time: ____________ Finish time: ____________  Date: ____________ Movements: ____________ Start time: ____________ Finish time: ____________ Date: ____________ Movements: ____________ Start time: ____________ Finish time: ____________ Date: ____________ Movements: ____________ Start time: ____________ Finish time: ____________ Date: ____________ Movements: ____________ Start time: ____________ Finish time: ____________ Date: ____________ Movements: ____________ Start time: ____________ Finish time: ____________ Date: ____________ Movements: ____________ Start time: ____________ Finish time: ____________   This information is not intended to replace advice given to you by your health care provider. Make sure you discuss any questions you have with your health care provider.   Document Released: 09/16/2006 Document Revised: 09/07/2014 Document Reviewed: 06/13/2012 Elsevier Interactive Patient Education 2016 [employer] Braxton Hicks Contractions Contractions of the uterus can occur throughout pregnancy. Contractions are not always a sign that you are in labor.  WHAT ARE BRAXTON HICKS CONTRACTIONS?  Contractions that occur before labor are called Braxton Hicks contractions, or false labor. Toward the end of pregnancy (32-34 weeks), these contractions can develop more often and may become more forceful. This is not true labor because these contractions do not result in opening (dilatation) and thinning of  the cervix. They are sometimes difficult to tell apart from true labor because these contractions can be forceful and people have different pain tolerances. You should not feel embarrassed if you go to the hospital with false labor. Sometimes, the only way to tell if you are in true labor is for your health care provider to look for changes in the cervix. If there are no prenatal problems or other health problems associated with the pregnancy, it is completely safe to be sent home with false labor and await the onset of true labor. HOW CAN YOU TELL THE DIFFERENCE BETWEEN TRUE AND FALSE LABOR? False Labor  The contractions of false labor are usually shorter and not as hard as those of true labor.   The contractions are usually irregular.   The contractions are often felt in the front of the lower abdomen and in the groin.   The contractions may go away when you walk around or change positions while lying down.   The contractions get weaker and are shorter lasting as time goes on.   The contractions do not usually become progressively stronger, regular, and closer together as with true labor.  True Labor 5. Contractions in true   labor last 30-70 seconds, become very regular, usually become more intense, and increase in frequency.  6. The contractions do not go away with walking.  7. The discomfort is usually felt in the top of the uterus and spreads to the lower abdomen and low back.  8. True labor can be determined by your health care provider with an exam. This will show that the cervix is dilating and getting thinner.  WHAT TO REMEMBER  Keep up with your usual exercises and follow other instructions given by your health care provider.   Take medicines as directed by your health care provider.   Keep your regular prenatal appointments.   Eat and drink lightly if you think you are going into labor.   If Braxton Hicks contractions are making you uncomfortable:   Change  your position from lying down or resting to walking, or from walking to resting.   Sit and rest in a tub of warm water.   Drink 2-3 glasses of water. Dehydration may cause these contractions.   Do slow and deep breathing several times an hour.  WHEN SHOULD I SEEK IMMEDIATE MEDICAL CARE? Seek immediate medical care if:  Your contractions become stronger, more regular, and closer together.   You have fluid leaking or gushing from your vagina.   You have a fever.   You pass blood-tinged mucus.   You have vaginal bleeding.   You have continuous abdominal pain.   You have low back pain that you never had before.   You feel your baby's head pushing down and causing pelvic pressure.   Your baby is not moving as much as it used to.    This information is not intended to replace advice given to you by your health care provider. Make sure you discuss any questions you have with your health care provider.   Document Released: 08/17/2005 Document Revised: 08/22/2013 Document Reviewed: 05/29/2013 Elsevier Interactive Patient Education 2016 Else[employer] Inc[employer]  

## 2015-06-12 NOTE — MAU Note (Signed)
Contractions and pressure off and on all night.

## 2015-06-12 NOTE — MAU Note (Signed)
Notified provider that patient had no cervical change and he would like to come see patient himself.

## 2015-06-12 NOTE — MAU Note (Signed)
Notified provider of patient with uterine irritability. G2P1. Cerclage removed on Monday was 2 now is 3/100/-2 with a reactive strip. Provider asked to recheck patient in an hour.

## 2015-06-12 NOTE — MAU Provider Note (Signed)
  History   Pelvic pressure  CSN: [id number]  Arrival date and time: 06/12/15 [id number]   None     Chief Complaint  Patient presents with  . Contractions  . Abdominal Pain   HPI  OB History    Gravida Para Term Preterm AB TAB SAB Ectopic Multiple Living   2 1        1       Past Medical History  Diagnosis Date  . Allergy   . GERD (gastroesophageal reflux disease)   . Other malaise and fatigue   . Hyposmolality and/or hyponatremia   . Premenstrual tension syndromes   . Scanty or infrequent menstruation   . Screening for lipoid disorders   . Nontoxic uninodular goiter   . Goiter, unspecified   . Hashimoto's thyroiditis   . Hives     and history of lip edema but no other airway symptoms. last symptoms in 2010  . PONV (postoperative nausea and vomiting)   . Feeling pelvic pressure during pregnancy in second trimester, antepartum 04/11/2015    Past Surgical History  Procedure Laterality Date  . Tonsillectomy  2007  . Total thyroidectomy      2012  . Dilation and evacuation  04/17/2011    Procedure: DILATATION AND EVACUATION (D&E);  Surgeon: Melony OverlyBrook A Silva;  Location: [facility];  Service: Gynecology;  Laterality: N/A;  . Cervical cerclage N/A 12/25/2014    Procedure: CERCLAGE CERVICAL;  Surgeon: Olivia Mackieichard Treyvone Chelf, MD;  Location: [facility];  Service: Gynecology;  Laterality: N/A;    Family History  Problem Relation Age of Onset  . Arthritis Father     Social History  Substance Use Topics  . Smoking status: Never Smoker   . Smokeless tobacco: Never Used  . Alcohol Use: No     Comment: 1 glass 2-3 times per week.    Allergies: No Known Allergies  Prescriptions prior to admission  Medication Sig Dispense Refill Last Dose  . calcium carbonate (TUMS - DOSED IN MG ELEMENTAL CALCIUM) 500 MG chewable tablet Chew 2 tablets by mouth 4 (four) times daily as needed for indigestion or heartburn.   06/11/2015 at Unknown time  . levothyroxine (SYNTHROID, LEVOTHROID) 200 MCG tablet Take  200 mcg by mouth at bedtime.   06/11/2015 at Unknown time  . Prenatal Vit-Fe Fumarate-FA (PRENATAL MULTIVITAMIN) TABS tablet Take 1 tablet by mouth daily.    06/11/2015 at Unknown time  . ranitidine (ZANTAC) 150 MG tablet Take 150 mg by mouth 2 (two) times daily.   06/11/2015 at Unknown time    ROS Physical Exam  WDWN WF NAD NCAT Neck: supple with FROM No CVAT Pelvic: noted No CVAT EXT: no cords Neuro: non focal Skin : intact Blood pressure 105/69, pulse 85, temperature 98 F (36.7 C), temperature source Oral, resp. rate 16, last menstrual period 09/11/2014.  Physical Exam  MAU Course  Procedures  MDM na  Assessment and Plan  PTL- no cervical change S/p cerclage removal DC home Reactive NST Labor warnings.   Channing Yeager J 06/12/2015, 9:57 AM

## 2015-06-15 ENCOUNTER — Inpatient Hospital Stay (HOSPITAL_COMMUNITY)
Admission: AD | Admit: 2015-06-15 | Discharge: 2015-06-17 | DRG: 775 | Disposition: A | Discharge status: Home / Self Care | Payer: 59 | Source: Ambulatory Visit | Attending: Obstetrics & Gynecology | Admitting: Obstetrics & Gynecology

## 2015-06-15 ENCOUNTER — Encounter (HOSPITAL_COMMUNITY): Payer: Self-pay

## 2015-06-15 DIAGNOSIS — N949 Unspecified condition associated with female genital organs and menstrual cycle: Secondary | ICD-10-CM

## 2015-06-15 DIAGNOSIS — R102 Pelvic and perineal pain: Secondary | ICD-10-CM

## 2015-06-15 DIAGNOSIS — IMO0001 Reserved for inherently not codable concepts without codable children: Secondary | ICD-10-CM | POA: Diagnosis present

## 2015-06-15 DIAGNOSIS — Z3A36 36 weeks gestation of pregnancy: Secondary | ICD-10-CM

## 2015-06-15 DIAGNOSIS — D62 Acute posthemorrhagic anemia: Secondary | ICD-10-CM | POA: Diagnosis not present

## 2015-06-15 DIAGNOSIS — O9081 Anemia of the puerperium: Secondary | ICD-10-CM | POA: Diagnosis not present

## 2015-06-15 DIAGNOSIS — O99284 Endocrine, nutritional and metabolic diseases complicating childbirth: Secondary | ICD-10-CM | POA: Diagnosis present

## 2015-06-15 DIAGNOSIS — K219 Gastro-esophageal reflux disease without esophagitis: Secondary | ICD-10-CM | POA: Diagnosis present

## 2015-06-15 DIAGNOSIS — E039 Hypothyroidism, unspecified: Secondary | ICD-10-CM | POA: Diagnosis present

## 2015-06-15 DIAGNOSIS — O9962 Diseases of the digestive system complicating childbirth: Secondary | ICD-10-CM | POA: Diagnosis present

## 2015-06-15 DIAGNOSIS — O3433 Maternal care for cervical incompetence, third trimester: Secondary | ICD-10-CM | POA: Diagnosis present

## 2015-06-15 DIAGNOSIS — O26892 Other specified pregnancy related conditions, second trimester: Secondary | ICD-10-CM

## 2015-06-15 HISTORY — DX: Acute posthemorrhagic anemia: D62

## 2015-06-15 HISTORY — DX: Reserved for inherently not codable concepts without codable children: IMO0001

## 2015-06-15 LAB — TYPE AND SCREEN
ABO/RH(D): O POS
Antibody Screen: NEGATIVE

## 2015-06-15 LAB — CBC
HEMATOCRIT: 31.3 % — AB (ref 36.0–46.0)
HEMOGLOBIN: 10.4 g/dL — AB (ref 12.0–15.0)
MCH: 29.1 pg (ref 26.0–34.0)
MCHC: 33.2 g/dL (ref 30.0–36.0)
MCV: 87.7 fL (ref 78.0–100.0)
Platelets: 160 10*3/uL (ref 150–400)
RBC: 3.57 MIL/uL — ABNORMAL LOW (ref 3.87–5.11)
RDW: 14.9 % (ref 11.5–15.5)
WBC: 9.5 10*3/uL (ref 4.0–10.5)

## 2015-06-15 LAB — RPR: RPR Ser Ql: NONREACTIVE

## 2015-06-15 LAB — POCT FERN TEST: POCT Fern Test: POSITIVE

## 2015-06-15 MED ORDER — ZOLPIDEM TARTRATE 5 MG PO TABS: 5.0000 mg | ORAL_TABLET | Freq: Every evening | ORAL | Status: DC | PRN

## 2015-06-15 MED ORDER — WITCH HAZEL-GLYCERIN EX PADS: 1.0000 "application " | MEDICATED_PAD | CUTANEOUS | Status: DC | PRN

## 2015-06-15 MED ORDER — SENNOSIDES-DOCUSATE SODIUM 8.6-50 MG PO TABS
2.0000 | ORAL_TABLET | ORAL | Status: DC
  Administered 2015-06-15 – 2015-06-17 (×2): 2 via ORAL
  Filled 2015-06-15 (×2): qty 2

## 2015-06-15 MED ORDER — CITRIC ACID-SODIUM CITRATE 334-500 MG/5ML PO SOLN: 30.0000 mL | ORAL | Status: DC | PRN

## 2015-06-15 MED ORDER — SIMETHICONE 80 MG PO CHEW: 80.0000 mg | CHEWABLE_TABLET | ORAL | Status: DC | PRN

## 2015-06-15 MED ORDER — OXYTOCIN BOLUS FROM INFUSION: 500.0000 mL | INTRAVENOUS | Status: DC

## 2015-06-15 MED ORDER — OXYCODONE-ACETAMINOPHEN 5-325 MG PO TABS: 1.0000 | ORAL_TABLET | ORAL | Status: DC | PRN

## 2015-06-15 MED ORDER — PRENATAL MULTIVITAMIN CH
1.0000 | ORAL_TABLET | Freq: Every day | ORAL | Status: DC
  Administered 2015-06-15 – 2015-06-17 (×3): 1 via ORAL
  Filled 2015-06-15 (×3): qty 1

## 2015-06-15 MED ORDER — LIDOCAINE HCL (PF) 1 % IJ SOLN
30.0000 mL | INTRAMUSCULAR | Status: DC | PRN
  Administered 2015-06-15: 30 mL via SUBCUTANEOUS
  Filled 2015-06-15: qty 30

## 2015-06-15 MED ORDER — DIBUCAINE 1 % RE OINT: 1.0000 "application " | TOPICAL_OINTMENT | RECTAL | Status: DC | PRN

## 2015-06-15 MED ORDER — LACTATED RINGERS IV SOLN: 500.0000 mL | INTRAVENOUS | Status: DC | PRN

## 2015-06-15 MED ORDER — LEVOTHYROXINE SODIUM 200 MCG PO TABS
200.0000 ug | ORAL_TABLET | Freq: Every day | ORAL | Status: DC
  Administered 2015-06-15 – 2015-06-16 (×2): 200 ug via ORAL
  Filled 2015-06-15 (×3): qty 1

## 2015-06-15 MED ORDER — ONDANSETRON HCL 4 MG/2ML IJ SOLN: 4.0000 mg | INTRAMUSCULAR | Status: DC | PRN

## 2015-06-15 MED ORDER — BENZOCAINE-MENTHOL 20-0.5 % EX AERO
1.0000 "application " | INHALATION_SPRAY | CUTANEOUS | Status: DC | PRN
  Administered 2015-06-17: 1 via TOPICAL
  Filled 2015-06-15 (×2): qty 56

## 2015-06-15 MED ORDER — TETANUS-DIPHTH-ACELL PERTUSSIS 5-2.5-18.5 LF-MCG/0.5 IM SUSP: 0.5000 mL | Freq: Once | INTRAMUSCULAR | Status: DC

## 2015-06-15 MED ORDER — LACTATED RINGERS IV SOLN
INTRAVENOUS | Status: DC
  Administered 2015-06-15 (×2): via INTRAVENOUS

## 2015-06-15 MED ORDER — LANOLIN HYDROUS EX OINT
TOPICAL_OINTMENT | CUTANEOUS | Status: DC | PRN
Start: 2015-06-15 — End: 2015-06-17

## 2015-06-15 MED ORDER — ONDANSETRON HCL 4 MG PO TABS: 4.0000 mg | ORAL_TABLET | ORAL | Status: DC | PRN

## 2015-06-15 MED ORDER — ACETAMINOPHEN 325 MG PO TABS: 650.0000 mg | ORAL_TABLET | ORAL | Status: DC | PRN

## 2015-06-15 MED ORDER — OXYCODONE-ACETAMINOPHEN 5-325 MG PO TABS: 2.0000 | ORAL_TABLET | ORAL | Status: DC | PRN

## 2015-06-15 MED ORDER — ONDANSETRON HCL 4 MG/2ML IJ SOLN: 4.0000 mg | Freq: Four times a day (QID) | INTRAMUSCULAR | Status: DC | PRN

## 2015-06-15 MED ORDER — IBUPROFEN 600 MG PO TABS
600.0000 mg | ORAL_TABLET | Freq: Four times a day (QID) | ORAL | Status: DC
  Administered 2015-06-15 – 2015-06-17 (×9): 600 mg via ORAL
  Filled 2015-06-15 (×9): qty 1

## 2015-06-15 MED ORDER — FLEET ENEMA 7-19 GM/118ML RE ENEM: 1.0000 | ENEMA | RECTAL | Status: DC | PRN

## 2015-06-15 MED ORDER — OXYTOCIN 40 UNITS IN LACTATED RINGERS INFUSION - SIMPLE MED: 62.5000 mL/h | INTRAVENOUS | Status: DC | PRN

## 2015-06-15 MED ORDER — OXYTOCIN 40 UNITS IN LACTATED RINGERS INFUSION - SIMPLE MED
62.5000 mL/h | INTRAVENOUS | Status: DC
  Administered 2015-06-15: 62.5 mL/h via INTRAVENOUS
  Filled 2015-06-15: qty 1000

## 2015-06-15 MED ORDER — DIPHENHYDRAMINE HCL 25 MG PO CAPS: 25.0000 mg | ORAL_CAPSULE | Freq: Four times a day (QID) | ORAL | Status: DC | PRN

## 2015-06-15 NOTE — H&P (Signed)
Lindsey Johnson is a 34 y.o. female [id number] presenting for SROM and Spontaneous Labor.  HPP/HPI:  H/O PTB 34 wks with Incompetent cervix.  Cerclage at 12 wks.  Makena.  BMethasone.  Cerclage removed at 36 1/7 wks because cerclage was under pressure.  Cervix 3 cm after removal.  Normal pregnancy otherwise.  LGA per US.  Good FMs.  UCs q4 min.  Clear AF.  No vaginal bleeding.  No PEC Sx.  OB History    Gravida Para Term Preterm AB TAB SAB Ectopic Multiple Living   2 1  1      1      Past Medical History  Diagnosis Date  . Allergy   . GERD (gastroesophageal reflux disease)   . Other malaise and fatigue   . Hyposmolality and/or hyponatremia   . Premenstrual tension syndromes   . Scanty or infrequent menstruation   . Screening for lipoid disorders   . Nontoxic uninodular goiter   . Goiter, unspecified   . Hashimoto's thyroiditis   . Hives     and history of lip edema but no other airway symptoms. last symptoms in 2010  . PONV (postoperative nausea and vomiting)   . Feeling pelvic pressure during pregnancy in second trimester, antepartum 04/11/2015   Past Surgical History  Procedure Laterality Date  . Tonsillectomy  2007  . Total thyroidectomy      2012  . Dilation and evacuation  04/17/2011    Procedure: DILATATION AND EVACUATION (D&E);  Surgeon: Melony OverlyBrook A Silva;  Location: [facility];  Service: Gynecology;  Laterality: N/A;  . Cervical cerclage N/A 12/25/2014    Procedure: CERCLAGE CERVICAL;  Surgeon: Olivia Mackieichard Taavon, MD;  Location: [facility];  Service: Gynecology;  Laterality: N/A;   Family History: family history includes Arthritis in her father. Social History:  reports that she has never smoked. She has never used smokeless tobacco. She reports that she does not drink alcohol or use illicit drugs.  No Known Allergies   Blood pressure 107/80, pulse 97, temperature 97.6 F (36.4 C), temperature source Axillary, resp. rate 18, height 5' 5.5" (1.664 m), weight 192 lb 12.8 oz (87.454 kg),  last menstrual period 09/11/2014.   Exam Physical Exam   VE 8/100/Vtx/+1 SROM clear AF  FHR 140's with good variability and accelerations.  No deceleration.  HPP:  Patient Active Problem List   Diagnosis Date Noted  . Normal labor 06/15/2015  . Feeling pelvic pressure during pregnancy in second trimester, antepartum 04/11/2015  . Acute sinus infection 06/29/2014  . Left Achilles tendinitis 06/29/2014  . Unspecified hypothyroidism 03/23/2013  . FATIGUE 10/07/2010  . THYROID NODULE 12/06/2009  . THYROMEGALY 11/29/2009  . ALLERGIC RHINITIS 11/29/2009  . GASTROESOPHAGEAL REFLUX DISEASE 11/29/2009  . MIGRAINE, MENSTRUAL 11/29/2009    Prenatal labs: ABO, Rh: --/--/O POS (10/15 0530) Antibody: NEG (10/15 0530) Rubella:  Immune RPR: Nonreactive (04/05 0000)  HBsAg: Negative (04/05 0000)  HIV: Non-reactive (04/05 0000)  Genetic testing: AFP1 neg. US anato: wnl 1 hr GTT: wnl at 119 GBS: Negative (10/05 0000)   Assessment/Plan: 36 6/7 wks with SROM/Spontaneous labor.  Excellent progression in active labor.  Cervical incompetence.  FHR Cat 1.  Comfortable with epidural.  Expectant management towards vaginal delivery.   Tucker Steedley,MARIE-LYNE 06/15/2015, 9:21 AM

## 2015-06-15 NOTE — MAU Note (Signed)
Contractions since 0200. Had cerclage removed on Monday and was 3/100% on Weds. Denies bleeding but leaking fld since 0200 with some pink tinge

## 2015-06-16 ENCOUNTER — Encounter (HOSPITAL_COMMUNITY): Payer: Self-pay | Admitting: Obstetrics and Gynecology

## 2015-06-16 DIAGNOSIS — IMO0001 Reserved for inherently not codable concepts without codable children: Secondary | ICD-10-CM

## 2015-06-16 DIAGNOSIS — D62 Acute posthemorrhagic anemia: Secondary | ICD-10-CM | POA: Diagnosis not present

## 2015-06-16 HISTORY — DX: Acute posthemorrhagic anemia: D62

## 2015-06-16 HISTORY — DX: Reserved for inherently not codable concepts without codable children: IMO0001

## 2015-06-16 LAB — CBC
HEMATOCRIT: 27.9 % — AB (ref 36.0–46.0)
HEMOGLOBIN: 8.9 g/dL — AB (ref 12.0–15.0)
MCH: 28.4 pg (ref 26.0–34.0)
MCHC: 31.9 g/dL (ref 30.0–36.0)
MCV: 89.1 fL (ref 78.0–100.0)
Platelets: 145 10*3/uL — ABNORMAL LOW (ref 150–400)
RBC: 3.13 MIL/uL — ABNORMAL LOW (ref 3.87–5.11)
RDW: 15.1 % (ref 11.5–15.5)
WBC: 10.4 10*3/uL (ref 4.0–10.5)

## 2015-06-16 MED ORDER — POLYSACCHARIDE IRON COMPLEX 150 MG PO CAPS
150.0000 mg | ORAL_CAPSULE | Freq: Two times a day (BID) | ORAL | Status: DC
  Administered 2015-06-16 – 2015-06-17 (×3): 150 mg via ORAL
  Filled 2015-06-16 (×3): qty 1

## 2015-06-16 MED ORDER — MAGNESIUM OXIDE 400 (241.3 MG) MG PO TABS
200.0000 mg | ORAL_TABLET | Freq: Every day | ORAL | Status: DC
  Administered 2015-06-16 – 2015-06-17 (×2): 200 mg via ORAL
  Filled 2015-06-16 (×3): qty 0.5

## 2015-06-16 NOTE — Lactation Note (Signed)
This note was copied from the chart of Boy Maryan CharBeth Imber. 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 Experienced BF for 2 years of LPT 34 week baby. Mom reports this baby is nursing well, she is hearing swallows with baby at the breast. Mom has not been pumping since baby has been to the breast so frequently. Not supplementing at this time. Baby has had adequate I/O up to this point. LPT hand out given to Mom along with Lactation brochure. Advised of OP services and support group. Reviewed calorie usage with BF for the LPT and advised if baby is becoming more sleepy or increase weight loss of greater than 3% in 24 hours, increase bili, then Mom needs to limit time at breast to 30 minutes to conserve calories and pump/supplement along with BF.  Mom had baby latched at this visit, baby demonstrating good suckling bursts, some swallows noted. Mom has some mild nipple tenderness, no breakdown noted. Advised EBM and comfort gels given with instructions. Encouraged to call for assist as desired.   Maternal Data Has patient been taught Hand Expression?: No (Mom experienced BF and knows how to hand express) Does the patient have breastfeeding experience prior to this delivery?: Yes  Feeding Feeding Type: Breast Fed Length of feed: 20 min  LATCH Score/Interventions          Comfort (Breast/Nipple): Filling, red/small blisters or bruises, mild/mod discomfort  Problem noted: Mild/Moderate discomfort Interventions (Mild/moderate discomfort): Comfort gels;Hand massage;Hand expression        Lactation Tools Discussed/Used Tools: Comfort gels   Consult Status Consult Status: Follow-up Date: 06/17/15 Follow-up type: In-patient    Alfred LevinsGranger, Yalanda Soderman Ann 06/16/2015, 5:40 PM

## 2015-06-16 NOTE — Progress Notes (Addendum)
Patient ID: Lindsey Johnson, female   DOB: Jul 16, 1981, 34 y.o.   MRN: [id number] PPD # 1 SVD  S:  Reports feeling a little sore, but well.             Tolerating po/ No nausea or vomiting             Bleeding is spotting             Pain controlled with ibuprofen (OTC)             Up ad lib / ambulatory / voiding without difficulties    Newborn  Information for the patient's newborn:  Gar GibbonFloyd, Boy Jessic [[id number]][[id number]]  female  breast feeding  / Circumcision in progress   O:  A & O x 3, in no apparent distress              VS:  Filed Vitals:   06/15/15 1230 06/15/15 1350 06/15/15 1726 06/16/15 0540  BP: 100/68 104/58 115/70 90/55  Pulse: 74 91 68 61  Temp:  98.5 F (36.9 C) 98.3 F (36.8 C) 98.1 F (36.7 C)  TempSrc:  Oral Oral Oral  Resp: 20 20 20 17   Height:      Weight:        LABS:  Recent Labs  06/15/15 0530 06/16/15 0520  WBC 9.5 10.4  HGB 10.4* 8.9*  HCT 31.3* 27.9*  PLT 160 145*    Blood type: --/--/O POS (10/15 0530)  Rubella: Immune (04/05 0000)   I&O: I/O last 3 completed shifts: In: -  Out: 382 [Blood:382]             Lungs: Clear and unlabored  Heart: regular rate and rhythm / no murmurs  Abdomen: soft, non-tender, non-distended              Fundus: firm, non-tender, U-1  Perineum: 1st degree repair healing well, mild edema  Lochia: scant  Extremities: No edema, no calf pain or tenderness, No Homans    A/P: PPD # 1  34 y.o., [id number]   Principal Problem:   Postpartum care following vaginal delivery (10/15) Active Problems:   Normal labor   Postpartum state   Acute blood loss anemia   Maternal iron deficiency anemia   Doing well - stable status  Routine post partum orders  Start Niferex 150 mg BID  Start Magnesium Oxide 200 mg daily  Anticipate discharge tomorrow    Raelyn MoraAWSON, ROLITTA, M, MSN, CNM 06/16/2015, 9:58 AM

## 2015-06-17 MED ORDER — MAGNESIUM OXIDE 400 (241.3 MG) MG PO TABS: 200.0000 mg | ORAL_TABLET | Freq: Every day | ORAL | Status: DC

## 2015-06-17 MED ORDER — OXYCODONE-ACETAMINOPHEN 5-325 MG PO TABS: 2.0000 | ORAL_TABLET | ORAL | Status: DC | PRN

## 2015-06-17 MED ORDER — POLYSACCHARIDE IRON COMPLEX 150 MG PO CAPS: 150.0000 mg | ORAL_CAPSULE | Freq: Two times a day (BID) | ORAL | Status: DC

## 2015-06-17 MED ORDER — IBUPROFEN 600 MG PO TABS: 600.0000 mg | ORAL_TABLET | Freq: Four times a day (QID) | ORAL | Status: AC

## 2015-06-17 NOTE — Discharge Summary (Signed)
OB Discharge Summary  Patient Name: Lindsey Johnson DOB: 1981-01-01 MRN: [id number]  Date of admission: 06/15/2015  Admitting diagnosis: 5124w6d, CTX / labor / hypothyroidism - stable Intrauterine pregnancy: 4424w6d     Secondary diagnosis: late preterm labor / incompetent cervix    Date of discharge: 06/17/2015     Discharge diagnosis: Late preterm vaginal delivery / ABL anemia / Hypothyroidism      Prenatal history: G2P0201   EDC : 07/07/2015, Alternate EDD Entry  [facility]  Primary provider : Dr Billy Coastaavon Prenatal course complicated by hypothyroidism - Hashimoto thyroiditis / incompetent cervix with prophylactic cerclage  Prenatal Labs: ABO, Rh: --/--/O POS (10/15 0530) Antibody: NEG (10/15 0530) Rubella: Immune (04/05 0000)   RPR: Non Reactive (10/15 0530)  HBsAg: Negative (04/05 0000)  HIV: Non-reactive (04/05 0000)  GBS: Negative (10/05 0000)    Tdap 2016 / no flu vaccine                                  Hospital course:  Onset of Labor With Vaginal Delivery      34 y.o. yo [id number] at 6124w6d was admitted in Active Laboron 06/15/2015.   Patient had an uncomplicated labor course as follows:   Membrane Rupture Time/Date: 2:15 AM ,06/15/2015    Intrapartum Procedures: Episiotomy: None [1]                                         Lacerations:  1st degree [2];Perineal [11]  Patient had a delivery of a Viable infant. 06/15/2015  Information for the patient's newborn:  Gar GibbonFloyd, Boy Kaitelyn [[id number]][[id number]]  Delivery Method: Vaginal, Spontaneous Delivery (Filed from Delivery Summary)    Pateint had an uncomplicated postpartum course.   She is ambulating, tolerating a regular diet, passing flatus, and urinating well. Patient is discharged home in stable condition on 06/17/2015 PPD @ 2 / newborn to remain inpatient rooming-in for hyperbilirubinemia on bili-lights.   Augmentation: none Delivering PROVIDER: LAVOIE, MARIE-LYNE                                                             Complications:Late preterm birth  Newborn Data: Live born female  Birth Weight: 6 lb 11.8 oz (3055 g) APGAR: 8, 9  Baby Feeding: Breast Disposition:rooming in  Post partum procedures:none  Postpartum contraception: Not Discussed    Labs: Lab Results  Component Value Date   WBC 10.4 06/16/2015   HGB 8.9* 06/16/2015   HCT 27.9* 06/16/2015   MCV 89.1 06/16/2015   PLT 145* 06/16/2015   CMP Latest Ref Rng 03/20/2013  Glucose 70 - 99 mg/dL 73  BUN 6 - 23 mg/dL 20  Creatinine 0.4 - 1.2 mg/dL 0.9  Sodium 130135 - 865145 mEq/L 138  Potassium 3.5 - 5.1 mEq/L 3.9  Chloride 96 - 112 mEq/L 104  CO2 19 - 32 mEq/L 27  Calcium 8.4 - 10.5 mg/dL 8.5  Total Protein 6.0 - 8.3 g/dL 7.7  Total Bilirubin 0.3 - 1.2 mg/dL 0.6  Alkaline Phos 39 - 117 U/L 70  AST 0 - 37 U/L 21  ALT 0 - 35 U/L 15    Physical Exam @ time of discharge:  Filed Vitals:   06/15/15 1726 06/16/15 0540 06/16/15 1800 06/17/15 0533  BP: 115/70 90/55 104/62 116/71  Pulse: 68 61 77 65  Temp: 98.3 F (36.8 C) 98.1 F (36.7 C) 98.7 F (37.1 C) 98.2 F (36.8 C)  TempSrc: Oral Oral Oral Oral  Resp: Height:      Weight:      SpO2:   100%     General: alert Lochia: appropriate Uterine Fundus: firm Perineum: well-approximated 1st degree repair Extremities: DVT Evaluation: No evidence of DVT seen on physical exam.   Discharge instructions:  "Baby and Me Booklet" and [facility] Booklet  Discharge Medications:    Medication List    STOP taking these medications        calcium carbonate 500 MG chewable tablet  Commonly known as:  TUMS - dosed in mg elemental calcium      TAKE these medications        ibuprofen 600 MG tablet  Commonly known as:  ADVIL,MOTRIN  Take 1 tablet (600 mg total) by mouth every 6 (six) hours.     iron polysaccharides 150 MG capsule  Commonly known as:  NIFEREX  Take 1 capsule (150 mg total) by mouth 2 (two) times daily.     levothyroxine 200  MCG tablet  Commonly known as:  SYNTHROID, LEVOTHROID  Take 200 mcg by mouth at bedtime.     magnesium oxide 400 (241.3 MG) MG tablet  Commonly known as:  MAG-OX  Take 0.5 tablets (200 mg total) by mouth daily. Take with iron DAILY 1-2 times to prevent constipation ( start 1 daily may increase to 2 daily if any constipation)     oxyCODONE-acetaminophen 5-325 MG tablet  Commonly known as:  PERCOCET/ROXICET  Take 2 tablets by mouth every 4 (four) hours as needed (for pain scale greater than 7).     prenatal multivitamin Tabs tablet  Take 1 tablet by mouth daily.     ranitidine 150 MG tablet  Commonly known as:  ZANTAC  Take 150 mg by mouth 2 (two) times daily.        Diet: routine diet  Activity: Advance as tolerated. Pelvic rest x 6 weeks.   Follow up:6 weeks    Signed: Marlinda Mike CNM, MSN, [facility] 06/17/2015, 10:16 AM

## 2015-06-17 NOTE — Lactation Note (Signed)
This note was copied from the chart of Lindsey Maryan CharBeth Ode. Lactation Consultation Note  Patient Name: Lindsey Johnson ZOXWR'UToday's Date: 06/17/2015 Reason for consult: Follow-up assessment;Hyperbilirubinemia;Late preterm infant;Infant weight loss   Follow up with mom for 46 hour old infant. Baby with 10 BF for 15-45 minutes, 2 bottles with EBM (5cc) and formula x 2 with 10-20 cc. Mom is using wide based bottle nipples she brought from home. Infant with 4 voids, 5 stools and 8 % weight loss. Mom is pumping using DEBP at bedside. She is receiving small amounts of EBM that she is feeding to infant. Momreports her nipples started getting sore last night, she is using comfort gels, there is not breakdown noted. She reports she is allowing infant to self latch in the cradle hold as she reports he does not like for the back of his head to be touched. Encouraged her to use the cross cradle hold or football hold in NB period to encourage a deeper latch. Infant is starting phototherapy today and will not be D/C today. Requested mom call for next BF for assessment.   Maternal Data Formula Feeding for Exclusion: No  Feeding Feeding Type: Breast Fed Length of feed: 18 min  LATCH Score/Interventions Latch:  (Unable to assess latch 2300-0700-was not called to room)                    Lactation Tools Discussed/Used     Consult Status Consult Status: Follow-up Date: 06/17/15 Follow-up type: In-patient    Silas FloodSharon S Jhostin Epps 06/17/2015, 8:56 AM

## 2015-06-17 NOTE — Discharge Instructions (Signed)
Iron-Rich Diet Iron is a mineral that helps your body to produce hemoglobin. Hemoglobin is a protein in your red blood cells that carries oxygen to your body's tissues. Eating too little iron may cause you to feel weak and tired, and it can increase your risk for infection. Eating enough iron is necessary for your body's metabolism, muscle function, and nervous system. Iron is naturally found in many foods. It can also be added to foods or fortified in foods. There are two types of dietary iron:  Heme iron. Heme iron is absorbed by the body more easily than nonheme iron. Heme iron is found in meat, poultry, and fish.  Nonheme iron. Nonheme iron is found in dietary supplements, iron-fortified grains, beans, and vegetables. You may need to follow an iron-rich diet if:  You have been diagnosed with iron deficiency or iron-deficiency anemia.  You have a condition that prevents you from absorbing dietary iron, such as:  Infection in your intestines.  Celiac disease. This involves long-lasting (chronic) inflammation of your intestines.  You do not eat enough iron.  You eat a diet that is high in foods that impair iron absorption.  You have lost a lot of blood.  You have heavy bleeding during your menstrual cycle.  You are pregnant. WHAT IS MY PLAN? Your health care provider may help you to determine how much iron you need per day based on your condition. Generally, when a person consumes sufficient amounts of iron in the diet, the following iron needs are met:  Men.  14-18 years old: 11 mg per day.  19-50 years old: 8 mg per day.  Women.   14-18 years old: 15 mg per day.  19-50 years old: 18 mg per day.  Over 50 years old: 8 mg per day.  Pregnant women: 27 mg per day.  Breastfeeding women: 9 mg per day. WHAT DO I NEED TO KNOW ABOUT AN IRON-RICH DIET?  Eat fresh fruits and vegetables that are high in vitamin C along with foods that are high in iron. This will help increase  the amount of iron that your body absorbs from food, especially with foods containing nonheme iron. Foods that are high in vitamin C include oranges, peppers, tomatoes, and mango.  Take iron supplements only as directed by your health care provider. Overdose of iron can be life-threatening. If you were prescribed iron supplements, take them with orange juice or a vitamin C supplement.  Cook foods in pots and pans that are made from iron.   Eat nonheme iron-containing foods alongside foods that are high in heme iron. This helps to improve your iron absorption.   Certain foods and drinks contain compounds that impair iron absorption. Avoid eating these foods in the same meal as iron-rich foods or with iron supplements. These include:  Coffee, black tea, and red wine.  Milk, dairy products, and foods that are high in calcium.  Beans, soybeans, and peas.  Whole grains.  When eating foods that contain both nonheme iron and compounds that impair iron absorption, follow these tips to absorb iron better.   Soak beans overnight before cooking.  Soak whole grains overnight and drain them before using.  Ferment flours before baking, such as using yeast in bread dough. WHAT FOODS CAN I EAT? Grains Iron-fortified breakfast cereal. Iron-fortified whole-wheat bread. Enriched rice. Sprouted grains. Vegetables Spinach. Potatoes with skin. Green peas. Broccoli. Red and green bell peppers. Fermented vegetables. Fruits Prunes. Raisins. Oranges. Strawberries. Mango. Grapefruit. Meats and Other Protein   Sources Beef liver. Oysters. Beef. Shrimp. Turkey. Chicken. Tuna. Sardines. Chickpeas. Nuts. Tofu. Beverages Tomato juice. Fresh orange juice. Prune juice. Hibiscus tea. Fortified instant breakfast shakes. Condiments Tahini. Fermented soy sauce. Sweets and Desserts Black-strap molasses.  Other Wheat germ. The items listed above may not be a complete list of recommended foods or  beverages. Contact your dietitian for more options. WHAT FOODS ARE NOT RECOMMENDED? Grains Whole grains. Bran cereal. Bran flour. Oats. Vegetables Artichokes. Brussels sprouts. Kale. Fruits Blueberries. Raspberries. Strawberries. Figs. Meats and Other Protein Sources Soybeans. Products made from soy protein. Dairy Milk. Cream. Cheese. Yogurt. Cottage cheese. Beverages Coffee. Black tea. Red wine. Sweets and Desserts Cocoa. Chocolate. Ice cream. Other Basil. Oregano. Parsley. The items listed above may not be a complete list of foods and beverages to avoid. Contact your dietitian for more information.   This information is not intended to replace advice given to you by your health care provider. Make sure you discuss any questions you have with your health care provider.   Document Released: 03/31/2005 Document Revised: 09/07/2014 Document Reviewed: 03/14/2014 Elsevier Interactive Patient Education 2016 [employer]  

## 2015-06-17 NOTE — Progress Notes (Signed)
PPD 2 SVD with 1st degree repair  S:  Reports feeling well             Tolerating po/ No nausea or vomiting             Bleeding is light             Pain controlled with motrin mostly             Up ad lib / ambulatory / voiding QS  Newborn breast feeding  / elevated bilirubin-treatment with bili-lights - no DC today  O:               VS: BP 116/71 mmHg  Pulse 65  Temp(Src) 98.2 F (36.8 C) (Oral)  Resp 18  Ht 5' 5.5" (1.664 m)  Wt 87.454 kg (192 lb 12.8 oz)  BMI 31.58 kg/m2  SpO2 100%  LMP 09/11/2014 (Exact Date)  Breastfeeding? Unknown   LABS:              Recent Labs  06/15/15 0530 06/16/15 0520  WBC 9.5 10.4  HGB 10.4* 8.9*  PLT 160 145*               Blood type: --/--/O POS (10/15 0530)  Rubella: Immune (04/05 0000)                   tdap current  / declines flu vaccine               Physical Exam:             Alert and oriented X3  Lungs: Clear and unlabored  Heart: regular rate and rhythm / no mumurs  Abdomen: soft, non-tender, non-distended              Fundus: firm, non-tender, U-1  Lochia: light  Extremities: no edema, no calf pain or tenderness    A: PPD # 1 VD with 1st degree repair              ABL anemia - stable             Hypothyroidism - stable on synthroid  Doing well - stable status  P: Routine post partum orders  Continue synthroid- recheck 2-4 weeks as instructed              Iron and magnesium x 6 weeks              DC Mom - will room-in with baby until newborn DC'd              WOB booklet - instructions reviewed  Marlinda MikeBAILEY, Akari Crysler CNM, MSN, FACNM 06/17/2015, 10:08 AM

## 2015-06-18 ENCOUNTER — Ambulatory Visit: Payer: Self-pay

## 2015-06-18 NOTE — Lactation Note (Signed)
This note was copied from the chart of Lindsey Johnson. Lactation Consultation Note Experienced BF mom states BF going well. Baby is going home on DPT for jaundice. Discussed importance of waking baby for feedings d/t possible sleepiness from hyperbilirubinemia. Encouraged breast massage before and after feedings to assess fullness of breast and changes after feedings. Reviewed engorgement and prevention, supply and demand.  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 Mom has comfort gels for slight soreness. Encouraged to obtain deep latch to prevent soreness.  Reviewed OP services. Patient Name: Lindsey Johnson ZOXWR'UToday's Date: 06/18/2015 Reason for consult: Follow-up assessment   Maternal Data    Feeding Feeding Type: Breast Fed Length of feed: 10 min  LATCH Score/Interventions Latch: Grasps breast easily, tongue down, lips flanged, rhythmical sucking. Intervention(s): Skin to skin;Teach feeding cues Intervention(s): Breast massage;Breast compression  Audible Swallowing: Spontaneous and intermittent  Type of Nipple: Everted at rest and after stimulation  Comfort (Breast/Nipple): Filling, red/small blisters or bruises, mild/mod discomfort  Problem noted: Mild/Moderate discomfort Interventions (Mild/moderate discomfort): Comfort gels  Hold (Positioning): No assistance needed to correctly position infant at breast.  LATCH Score: 9  Lactation Tools Discussed/Used     Consult Status Consult Status: Complete Date: 06/18/15    Charyl DancerCARVER, Anthony Tamburo G 06/18/2015, 10:18 AM

## 2015-11-25 DIAGNOSIS — H5213 Myopia, bilateral: Secondary | ICD-10-CM | POA: Diagnosis not present

## 2015-11-25 DIAGNOSIS — H52223 Regular astigmatism, bilateral: Secondary | ICD-10-CM | POA: Diagnosis not present

## 2016-05-19 DIAGNOSIS — Z6828 Body mass index (BMI) 28.0-28.9, adult: Secondary | ICD-10-CM | POA: Diagnosis not present

## 2016-05-19 DIAGNOSIS — Z01419 Encounter for gynecological examination (general) (routine) without abnormal findings: Secondary | ICD-10-CM | POA: Diagnosis not present

## 2016-05-19 DIAGNOSIS — Z1151 Encounter for screening for human papillomavirus (HPV): Secondary | ICD-10-CM | POA: Diagnosis not present

## 2016-05-21 DIAGNOSIS — H1031 Unspecified acute conjunctivitis, right eye: Secondary | ICD-10-CM | POA: Diagnosis not present

## 2016-09-09 ENCOUNTER — Encounter: Payer: Self-pay | Admitting: Physician Assistant

## 2016-09-09 ENCOUNTER — Ambulatory Visit: Payer: Self-pay | Admitting: Physician Assistant

## 2016-09-09 VITALS — BP 94/70 | HR 70 | Temp 98.5°F

## 2016-09-09 DIAGNOSIS — R300 Dysuria: Secondary | ICD-10-CM

## 2016-09-09 DIAGNOSIS — R109 Unspecified abdominal pain: Secondary | ICD-10-CM

## 2016-09-09 LAB — POCT URINALYSIS DIPSTICK
BILIRUBIN UA: NEGATIVE
GLUCOSE UA: NEGATIVE
KETONES UA: NEGATIVE
LEUKOCYTES UA: NEGATIVE
NITRITE UA: NEGATIVE
Spec Grav, UA: 1.025
Urobilinogen, UA: 0.2
pH, UA: 6.5

## 2016-09-09 NOTE — Progress Notes (Signed)
S: c/o r sided flank pain, no fever/chills/v/d, ?if uti, father has hx of kidney stones, no other complaints, sx for 3-4 days  O: vitals wnl, nad, lungs c t a, cv rrr, no cva tenderness, abd soft nontender bs normal, ua 1+ blood  A: hematuria, ?kidney stone with flank pain  P: f/u prn, go to ER if sx worsening, drink plenty of water and add lemon

## 2016-11-19 ENCOUNTER — Ambulatory Visit (INDEPENDENT_AMBULATORY_CARE_PROVIDER_SITE_OTHER): Payer: 59 | Admitting: Family Medicine

## 2016-11-19 ENCOUNTER — Encounter: Payer: Self-pay | Admitting: Family Medicine

## 2016-11-19 VITALS — BP 98/72 | HR 61 | Temp 98.2°F | Ht 66.0 in | Wt 189.0 lb

## 2016-11-19 DIAGNOSIS — E039 Hypothyroidism, unspecified: Secondary | ICD-10-CM

## 2016-11-19 DIAGNOSIS — R51 Headache: Secondary | ICD-10-CM | POA: Diagnosis not present

## 2016-11-19 DIAGNOSIS — R519 Headache, unspecified: Secondary | ICD-10-CM | POA: Insufficient documentation

## 2016-11-19 DIAGNOSIS — M62838 Other muscle spasm: Secondary | ICD-10-CM | POA: Diagnosis not present

## 2016-11-19 LAB — TSH: TSH: 215.22 u[IU]/mL — ABNORMAL HIGH (ref 0.35–4.50)

## 2016-11-19 LAB — T4, FREE: Free T4: 0.28 ng/dL — ABNORMAL LOW (ref 0.60–1.60)

## 2016-11-19 NOTE — Patient Instructions (Addendum)
Great to see you.  Use heating pad and massage.   I will call you with your lab results.

## 2016-11-19 NOTE — Assessment & Plan Note (Signed)
Likely due to large left sided trapezius spasm. Discussed anatomy of the trapezius muscle.  Since she is breast feeding, she does not want rx for muscle relaxants which I agree with as well. Advised heat, given written rx for massage therapy. Call or return to clinic prn if these symptoms worsen or fail to improve as anticipated. The patient indicates understanding of these issues and agrees with the plan.

## 2016-11-19 NOTE — Progress Notes (Signed)
Subjective:   Patient ID: Lindsey Johnson, female    DOB: 1981/08/24, 36 y.o.   MRN: [id number]  Lindsey Johnson is a pleasant 36 y.o. year old female pt of Dr. Ermalene SearingBedsole, new to me, who presents to clinic today with Headache (Pt  headache back of the head for about over 1 month.)  on 11/19/2016  HPI:  HA- back of head for over a month. HA is located in the back of her head, left sided.  Not associated with nausea, vomiting or photophobia.  Tries not to take rx, so she hasn't really taken anything for it. Maybe Ibuprofen once or twice.  HA do not wake her from sleeep.  Has noticed some left eye intermittent twitching but she is not sure if this is related.  She does not drink caffeine.  She is breast feeding.  Stopped taking her synthroid- "because I forgot."  Works two jobs, under a lot of stress.  She is a Nurse, learning disabilityICU nurse.   Current Outpatient Prescriptions on File Prior to Visit  Medication Sig Dispense Refill  . ibuprofen (ADVIL,MOTRIN) 600 MG tablet Take 1 tablet (600 mg total) by mouth every 6 (six) hours. 30 tablet 0  . levonorgestrel (MIRENA) 20 MCG/24HR IUD 1 each by Intrauterine route once.    Marland Kitchen. levothyroxine (SYNTHROID, LEVOTHROID) 200 MCG tablet Take 200 mcg by mouth at bedtime.    . Prenatal Vit-Fe Fumarate-FA (PRENATAL MULTIVITAMIN) TABS tablet Take 1 tablet by mouth daily.     . ranitidine (ZANTAC) 150 MG tablet Take 150 mg by mouth 2 (two) times daily.     No current facility-administered medications on file prior to visit.     Allergies  Allergen Reactions  . Other Diarrhea, Hives and Swelling    Reports "unknown food allergy"    Past Medical History:  Diagnosis Date  . Acute blood loss anemia 06/16/2015  . Allergy   . Feeling pelvic pressure during pregnancy in second trimester, antepartum 04/11/2015  . GERD (gastroesophageal reflux disease)   . Goiter, unspecified   . Hashimoto's thyroiditis   . Hives    and history of lip edema but no other airway  symptoms. last symptoms in 2010  . Hyposmolality and/or hyponatremia   . Maternal iron deficiency anemia 06/16/2015  . Nontoxic uninodular goiter   . Other malaise and fatigue   . PONV (postoperative nausea and vomiting)   . Postpartum care following vaginal delivery (10/15) 06/15/2015  . Premenstrual tension syndromes   . Scanty or infrequent menstruation   . Screening for lipoid disorders     Past Surgical History:  Procedure Laterality Date  . CERVICAL CERCLAGE N/A 12/25/2014   Procedure: CERCLAGE CERVICAL;  Surgeon: Olivia Mackieichard Taavon, MD;  Location: [facility];  Service: Gynecology;  Laterality: N/A;  . DILATION AND EVACUATION  04/17/2011   Procedure: DILATATION AND EVACUATION (D&E);  Surgeon: Melony OverlyBrook A Silva;  Location: [facility];  Service: Gynecology;  Laterality: N/A;  . TONSILLECTOMY  2007  . TOTAL THYROIDECTOMY     2012    Family History  Problem Relation Age of Onset  . Arthritis Father     Social History   Social History  . Marital status: Married    Spouse name: N/A  . Number of children: N/A  . Years of education: N/A   Occupational History  . Engineer, siteMedical Assistant at 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 Smoking status:  Never Smoker  . Smokeless tobacco: Never Used  . Alcohol use No     Comment: 1 glass 2-3 times per week.  . Drug use: No  . Sexual activity: Yes   Other Topics Concern  . Not on file   Social History Narrative   No regular exercise.   Diet:  Fruits, vegetables, water, cheese, minimal milk.   The PMH, PSH, Social History, Family History, Medications, and allergies have been reviewed in [facility], and have been updated if relevant.   Review of Systems  Constitutional: Negative.   Musculoskeletal: Positive for back pain and neck pain.  Neurological: Positive for headaches. Negative for tremors, seizures, syncope, speech difficulty, weakness, light-headedness and numbness.  Hematological:  Negative.   Psychiatric/Behavioral: Negative.   All other systems reviewed and are negative.      Objective:    BP 98/72   Pulse 61   Temp 98.2 F (36.8 C)   Ht 5\' 6"  (1.676 m)   Wt 189 lb (85.7 kg)   SpO2 98%   BMI 30.51 kg/m    Physical Exam  Constitutional: She is oriented to person, place, and time. She appears well-developed and well-nourished. No distress.  HENT:  Head: Normocephalic and atraumatic.  Cardiovascular: Normal rate.   Pulmonary/Chest: Effort normal.  Musculoskeletal:       Arms: Neurological: She is alert and oriented to person, place, and time. She displays normal reflexes. No cranial nerve deficit. She exhibits normal muscle tone. Coordination normal.  Skin: Skin is warm and dry. She is not diaphoretic.  Psychiatric: She has a normal mood and affect. Her behavior is normal. Judgment and thought content normal.  Nursing note and vitals reviewed.         Assessment & Plan:   Hypothyroidism, unspecified type - Plan: TSH, T4, Free  Nonintractable headache, unspecified chronicity pattern, unspecified headache type  Trapezius muscle spasm No Follow-up on file.\

## 2016-11-19 NOTE — Assessment & Plan Note (Signed)
Check TSH, FT4, T3 today.

## 2016-11-20 ENCOUNTER — Telehealth: Payer: Self-pay | Admitting: Family Medicine

## 2016-11-20 NOTE — Telephone Encounter (Signed)
-----   Message from Dianne Dun, MD sent at 11/20/2016  9:05 AM EDT ----- Will route to PCP--TSH very abnormal, has not been taking her synthroid.  Saw patient for another complaint but she asked that I check this as well.

## 2016-12-15 ENCOUNTER — Other Ambulatory Visit (INDEPENDENT_AMBULATORY_CARE_PROVIDER_SITE_OTHER): Payer: 59

## 2016-12-15 ENCOUNTER — Telehealth: Payer: Self-pay | Admitting: Family Medicine

## 2016-12-15 DIAGNOSIS — R238 Other skin changes: Secondary | ICD-10-CM

## 2016-12-15 DIAGNOSIS — R233 Spontaneous ecchymoses: Secondary | ICD-10-CM

## 2016-12-15 DIAGNOSIS — Z13220 Encounter for screening for lipoid disorders: Secondary | ICD-10-CM | POA: Diagnosis not present

## 2016-12-15 DIAGNOSIS — E039 Hypothyroidism, unspecified: Secondary | ICD-10-CM

## 2016-12-15 LAB — COMPREHENSIVE METABOLIC PANEL
ALT: 47 U/L — ABNORMAL HIGH (ref 0–35)
AST: 31 U/L (ref 0–37)
Albumin: 4.6 g/dL (ref 3.5–5.2)
Alkaline Phosphatase: 104 U/L (ref 39–117)
BUN: 20 mg/dL (ref 6–23)
CO2: 30 meq/L (ref 19–32)
Calcium: 9.1 mg/dL (ref 8.4–10.5)
Chloride: 102 mEq/L (ref 96–112)
Creatinine, Ser: 0.79 mg/dL (ref 0.40–1.20)
GFR: 88.82 mL/min (ref >60.00)
GLUCOSE: 94 mg/dL (ref 70–99)
Potassium: 3.8 mEq/L (ref 3.5–5.1)
SODIUM: 139 meq/L (ref 135–145)
Total Bilirubin: 0.5 mg/dL (ref 0.2–1.2)
Total Protein: 7.7 g/dL (ref 6.0–8.3)

## 2016-12-15 LAB — CBC WITH DIFFERENTIAL/PLATELET
BASOS PCT: 0.7 % (ref 0.0–3.0)
Basophils Absolute: 0 10*3/uL (ref 0.0–0.1)
Eosinophils Absolute: 0.1 10*3/uL (ref 0.0–0.7)
Eosinophils Relative: 0.9 % (ref 0.0–5.0)
HCT: 36.7 % (ref 36.0–46.0)
HEMOGLOBIN: 12.2 g/dL (ref 12.0–15.0)
Lymphocytes Relative: 26.3 % (ref 12.0–46.0)
Lymphs Abs: 1.7 10*3/uL (ref 0.7–4.0)
MCHC: 33.3 g/dL (ref 30.0–36.0)
MCV: 92.6 fl (ref 78.0–100.0)
MONO ABS: 0.5 10*3/uL (ref 0.1–1.0)
MONOS PCT: 8.2 % (ref 3.0–12.0)
Neutro Abs: 4.1 10*3/uL (ref 1.4–7.7)
Neutrophils Relative %: 63.9 % (ref 43.0–77.0)
Platelets: 201 10*3/uL (ref 150.0–400.0)
RBC: 3.96 Mil/uL (ref 3.87–5.11)
RDW: 13.2 % (ref 11.5–15.5)
WBC: 6.5 10*3/uL (ref 4.0–10.5)

## 2016-12-15 LAB — TSH: TSH: 3.35 u[IU]/mL (ref 0.35–4.50)

## 2016-12-15 LAB — LIPID PANEL
Cholesterol: 161 mg/dL (ref 0–200)
HDL: 63.3 mg/dL (ref >39.00)
LDL Cholesterol: 89 mg/dL (ref 0–99)
NONHDL: 97.96
Total CHOL/HDL Ratio: 3
Triglycerides: 46 mg/dL (ref 0.0–149.0)
VLDL: 9.2 mg/dL (ref 0.0–40.0)

## 2016-12-15 LAB — T3, FREE: T3, Free: 3.7 pg/mL (ref 2.3–4.2)

## 2016-12-15 NOTE — Telephone Encounter (Signed)
-----   Message from Alvina Chou sent at 12/01/2016  2:45 PM EDT ----- Regarding: Lab orders for Tuesday, 4.17.18 Patient is scheduled for CPX labs, please order future labs, Thanks , Camelia Eng

## 2016-12-15 NOTE — Addendum Note (Signed)
Addended by: Baldomero Lamy on: 12/15/2016 02:31 PM   Modules accepted: Orders

## 2016-12-16 LAB — T4, FREE: Free T4: 0.85 ng/dL (ref 0.60–1.60)

## 2016-12-18 ENCOUNTER — Ambulatory Visit (INDEPENDENT_AMBULATORY_CARE_PROVIDER_SITE_OTHER): Payer: 59 | Admitting: Family Medicine

## 2016-12-18 ENCOUNTER — Encounter: Payer: Self-pay | Admitting: Family Medicine

## 2016-12-18 VITALS — BP 98/64 | HR 70 | Temp 98.7°F | Ht 66.5 in | Wt 184.5 lb

## 2016-12-18 DIAGNOSIS — R945 Abnormal results of liver function studies: Secondary | ICD-10-CM

## 2016-12-18 DIAGNOSIS — Z0000 Encounter for general adult medical examination without abnormal findings: Secondary | ICD-10-CM | POA: Diagnosis not present

## 2016-12-18 DIAGNOSIS — E039 Hypothyroidism, unspecified: Secondary | ICD-10-CM

## 2016-12-18 DIAGNOSIS — R7989 Other specified abnormal findings of blood chemistry: Secondary | ICD-10-CM | POA: Diagnosis not present

## 2016-12-18 NOTE — Progress Notes (Signed)
Pre visit review using our clinic review tool, if applicable. No additional management support is needed unless otherwise documented below in the visit note. 

## 2016-12-18 NOTE — Progress Notes (Signed)
Subjective:    Patient ID: Lindsey Johnson, female    DOB: Aug 20, 1981, 36 y.o.   MRN: [id number]  HPI The patient is here for annual wellness exam and preventative care.   Hypothyroidism:  Good control back on synthroid. Feels much much better. Lab Results  Component Value Date   TSH 3.35 12/15/2016    Reviewed labs in detail. Good control of cholesterol, improved. Exercise: stay active. Diet: moderate   GYN Dr. Billy Coast    Social History /Family History/Past Medical History reviewed in detail and updated in EMR if needed. Blood pressure 98/64, pulse 70, temperature 98.7 F (37.1 C), temperature source Oral, height 5' 6.5" (1.689 m), weight 184 lb 8 oz (83.7 kg), currently breastfeeding.   Review of Systems  Constitutional: Negative for fatigue and fever.  HENT: Negative for congestion.   Eyes: Negative for pain.  Respiratory: Negative for cough and shortness of breath.   Cardiovascular: Negative for chest pain, palpitations and leg swelling.  Gastrointestinal: Negative for abdominal pain.  Genitourinary: Negative for dysuria and vaginal bleeding.  Musculoskeletal: Negative for back pain.  Neurological: Negative for syncope, light-headedness and headaches.  Psychiatric/Behavioral: Negative for dysphoric mood.       Objective:   Physical Exam  Constitutional: Vital signs are normal. She appears well-developed and well-nourished. She is cooperative.  Non-toxic appearance. She does not appear ill. No distress.  HENT:  Head: Normocephalic.  Right Ear: Hearing, tympanic membrane, external ear and ear canal normal.  Left Ear: Hearing, tympanic membrane, external ear and ear canal normal.  Nose: Nose normal.  Eyes: Conjunctivae, EOM and lids are normal. Pupils are equal, round, and reactive to light. Lids are everted and swept, no foreign bodies found.  Neck: Trachea normal and normal range of motion. Neck supple. Carotid bruit is not present. No thyroid mass and no thyromegaly  present.  Cardiovascular: Normal rate, regular rhythm, S1 normal, S2 normal, normal heart sounds and intact distal pulses.  Exam reveals no gallop.   No murmur heard. Pulmonary/Chest: Effort normal and breath sounds normal. No respiratory distress. She has no wheezes. She has no rhonchi. She has no rales.  Abdominal: Soft. Normal appearance and bowel sounds are normal. She exhibits no distension, no fluid wave, no abdominal bruit and no mass. There is no hepatosplenomegaly. There is no tenderness. There is no rebound, no guarding and no CVA tenderness. No hernia.  Lymphadenopathy:    She has no cervical adenopathy.    She has no axillary adenopathy.  Neurological: She is alert. She has normal strength. No cranial nerve deficit or sensory deficit.  Skin: Skin is warm, dry and intact. No rash noted.  Psychiatric: Her speech is normal and behavior is normal. Judgment normal. Her mood appears not anxious. Cognition and memory are normal. She does not exhibit a depressed mood.          Assessment & Plan:  The patient's preventative maintenance and recommended screening tests for an annual wellness exam were reviewed in full today. Brought up to date unless services declined.  Counselled on the importance of diet, exercise, and its role in overall health and mortality. The patient's FH and SH was reviewed, including their home life, tobacco status, and drug and alcohol status.   Vaccines: update Pap/DVE:  GYN Dr. Billy Coast... due Mammo: no early breast cancer in family. Colon:  No early family history Smoking Status: none ETOH/ drug use: 1 drink per day/ drug  HIV screen:   done

## 2016-12-18 NOTE — Patient Instructions (Addendum)
Work on increasing exercise, work on [employer].  Limit alcohol.. Decrease fat in diet.. Return in 3-6 month for liver enzyme recheck.  Keep appt as planned with GYN.

## 2016-12-18 NOTE — Assessment & Plan Note (Signed)
Likely fatty liver/ETOH use. Decrease fa in diet and ETOH use.  No family history, no tylenol use.  Re-eval in 3-6 months.

## 2016-12-18 NOTE — Assessment & Plan Note (Signed)
Well controlled. Continue current medication.  

## 2017-01-07 ENCOUNTER — Other Ambulatory Visit: Payer: Self-pay

## 2017-01-07 MED ORDER — LEVOTHYROXINE SODIUM 200 MCG PO TABS: 200.0000 ug | ORAL_TABLET | Freq: Every day | ORAL | 1 refills | Status: AC

## 2017-01-07 NOTE — Telephone Encounter (Signed)
Pt request refill [facility]othyroxine to [facility]. Dr Ermalene SearingBedsole has not filled before but at 12/18/16 office visit pt said Dr Ermalene SearingBedsole would refill thyroid med for pt and in 12/18/16 note Dr Ermalene SearingBedsole had continue current med under thyroid section; refilled levothyroxine 200 mcg # 90 x 1. FYI to Dr Ermalene SearingBedsole.

## 2017-03-02 ENCOUNTER — Telehealth: Payer: 59 | Admitting: Family

## 2017-03-02 ENCOUNTER — Telehealth: Payer: Self-pay

## 2017-03-02 DIAGNOSIS — J028 Acute pharyngitis due to other specified organisms: Secondary | ICD-10-CM

## 2017-03-02 MED ORDER — AMOXICILLIN 500 MG PO TABS
1000.0000 mg | ORAL_TABLET | Freq: Two times a day (BID) | ORAL | 0 refills | Status: DC
Start: 2017-03-02 — End: 2018-08-02

## 2017-03-02 NOTE — Telephone Encounter (Signed)
Per chart review tab pt had e visit today. FYI to Dr Ermalene SearingBedsole.

## 2017-03-02 NOTE — Telephone Encounter (Signed)
PLEASE NOTE: All timestamps contained within this report are represented as [place] Standard Time. CONFIDENTIALTY NOTICE: This fax transmission is intended only for the addressee. It contains information that is legally privileged, confidential or otherwise protected from use or disclosure. If you are not the intended recipient, you are strictly prohibited from reviewing, disclosing, copying using or disseminating any of this information or taking any action in reliance on or regarding this information. If you have received this fax in error, please notify us immediately by telephone so that we can arrange for its return to us. Phone: [phone], Toll-Free: [phone], Fax: [phone] Page: 1 of 1 Call Id: [id number] [facility] - Client Nonclinical Telephone Record [facility] - [facility] - Day Physician Kerby NoraBedsole, Amy - MD Contact Type Call Who Is Calling Patient / Member / Family / Caregiver Caller Name Lindsey Johnson Caller Phone Number [phone] Patient Name Lindsey Johnson Call Type Message Only Information Provided Reason for Call Medication Question / Request Initial Comment wants rx for strep throat, refused service Additional Comment Call Closed By: Salvatore Marvelavid Miller Transaction Date/Time: 03/02/2017 2:17:50 PM (ET)

## 2017-03-02 NOTE — Progress Notes (Signed)
We are sorry that you are not feeling well.  Here is how we plan to help!  Based on your presentation I believe you most likely have Strep Pharyngitis     I have prescribed Amoxil 500 mg 2 tabs twice a day x 7 days  HOME CARE . Only take medications as instructed by your medical team. . Complete the entire course of an antibiotic. . Drink plenty of fluids and get plenty of rest. . Avoid close contacts especially the very young and the elderly . Cover your mouth if you cough or cough into your sleeve. . Always remember to wash your hands . A steam or ultrasonic humidifier can help congestion.   GET HELP RIGHT AWAY IF: . You develop worsening fever. . You become short of breath . You cough up blood. . Your symptoms persist after you have completed your treatment plan MAKE SURE YOU   Understand these instructions.  Will watch your condition.  Will get help right away if you are not doing well or get worse.  Your e-visit answers were reviewed by a board certified advanced clinical practitioner to complete your personal care plan.  Depending on the condition, your plan could have included both over the counter or prescription medications. If there is a problem please reply  once you have received a response from your provider. Your safety is important to us.  If you have drug allergies check your prescription carefully.    You can use MyChart to ask questions about today's visit, request a non-urgent call back, or ask for a work or school excuse for 24 hours related to this e-Visit. If it has been greater than 24 hours you will need to follow up with your provider, or enter a new e-Visit to address those concerns. You will get an e-mail in the next two days asking about your experience.  I hope that your e-visit has been valuable and will speed your recovery. Thank you for using e-visits.

## 2017-07-12 ENCOUNTER — Telehealth: Payer: 59 | Admitting: Family

## 2017-07-12 DIAGNOSIS — J029 Acute pharyngitis, unspecified: Secondary | ICD-10-CM

## 2017-07-12 MED ORDER — BENZONATATE 100 MG PO CAPS
100.0000 mg | ORAL_CAPSULE | Freq: Three times a day (TID) | ORAL | 0 refills | Status: DC | PRN
Start: 2017-07-12 — End: 2018-09-05

## 2017-07-12 MED ORDER — PREDNISONE 5 MG PO TABS: 5.0000 mg | ORAL_TABLET | ORAL | 0 refills | Status: DC

## 2017-07-12 NOTE — Progress Notes (Signed)
Thank you for the details you included in the comment boxes. Those details are very helpful in determining the best course of treatment for you and help us to provide the best care. This particular situation does not indicate antibiotics at this point as 89% of these infections are viral. Please see the treatment plan below so that you are recover more quickly.   We are sorry that you are not feeling well.  Here is how we plan to help!  Based on your presentation I believe you most likely have A cough due to a virus.  This is called viral bronchitis and is best treated by rest, plenty of fluids and control of the cough.  You may use Ibuprofen or Tylenol as directed to help your symptoms.     In addition you may use A non-prescription cough medication called Mucinex DM: take 2 tablets every 12 hours. and A prescription cough medication called Tessalon Perles 100mg . You may take 1-2 capsules every 8 hours as needed for your cough.  Sterapred 5 mg dosepak  From your responses in the eVisit questionnaire you describe inflammation in the upper respiratory tract which is causing a significant cough.  This is commonly called Bronchitis and has four common causes:    Allergies  Viral Infections  Acid Reflux  Bacterial Infection Allergies, viruses and acid reflux are treated by controlling symptoms or eliminating the cause. An example might be a cough caused by taking certain blood pressure medications. You stop the cough by changing the medication. Another example might be a cough caused by acid reflux. Controlling the reflux helps control the cough.  USE OF BRONCHODILATOR ("RESCUE") INHALERS: There is a risk from using your bronchodilator too frequently.  The risk is that over-reliance on a medication which only relaxes the muscles surrounding the breathing tubes can reduce the effectiveness of medications prescribed to reduce swelling and congestion of the tubes themselves.  Although you feel brief  relief from the bronchodilator inhaler, your asthma may actually be worsening with the tubes becoming more swollen and filled with mucus.  This can delay other crucial treatments, such as oral steroid medications. If you need to use a bronchodilator inhaler daily, several times per day, you should discuss this with your provider.  There are probably better treatments that could be used to keep your asthma under control.     HOME CARE . Only take medications as instructed by your medical team. . Complete the entire course of an antibiotic. . Drink plenty of fluids and get plenty of rest. . Avoid close contacts especially the very young and the elderly . Cover your mouth if you cough or cough into your sleeve. . Always remember to wash your hands . A steam or ultrasonic humidifier can help congestion.   GET HELP RIGHT AWAY IF: . You develop worsening fever. . You become short of breath . You cough up blood. . Your symptoms persist after you have completed your treatment plan MAKE SURE YOU   Understand these instructions.  Will watch your condition.  Will get help right away if you are not doing well or get worse.  Your e-visit answers were reviewed by a board certified advanced clinical practitioner to complete your personal care plan.  Depending on the condition, your plan could have included both over the counter or prescription medications. If there is a problem please reply  once you have received a response from your provider. Your safety is important to us.  If  you have drug allergies check your prescription carefully.    You can use MyChart to ask questions about today's visit, request a non-urgent call back, or ask for a work or school excuse for 24 hours related to this e-Visit. If it has been greater than 24 hours you will need to follow up with your provider, or enter a new e-Visit to address those concerns. You will get an e-mail in the next two days asking about your  experience.  I hope that your e-visit has been valuable and will speed your recovery. Thank you for using e-visits.

## 2017-07-14 ENCOUNTER — Telehealth: Payer: 59 | Admitting: Nurse Practitioner

## 2017-07-14 DIAGNOSIS — J0100 Acute maxillary sinusitis, unspecified: Secondary | ICD-10-CM

## 2017-07-14 MED ORDER — AMOXICILLIN-POT CLAVULANATE 875-125 MG PO TABS: 1.0000 | ORAL_TABLET | Freq: Two times a day (BID) | ORAL | 0 refills | Status: DC

## 2017-07-14 NOTE — Progress Notes (Signed)

## 2017-07-27 ENCOUNTER — Ambulatory Visit: Payer: Self-pay | Admitting: Physician Assistant

## 2017-07-27 ENCOUNTER — Encounter: Payer: Self-pay | Admitting: Physician Assistant

## 2017-07-27 VITALS — BP 100/70 | HR 96 | Temp 98.7°F

## 2017-07-27 DIAGNOSIS — B349 Viral infection, unspecified: Secondary | ICD-10-CM

## 2017-07-27 NOTE — Progress Notes (Signed)
   Subjective: Sore throat     Patient ID: Lindsey Johnson, female    DOB: 06/03/1981, 36 y.o.   MRN: [id number]  HPI Patient complain of body aches, sore throat, fever and nonproductive cough for 2 days. Patient denies nausea, no vomiting, diarrhea. Patient state taken over-the-counter Advil and Tylenol. Patient has taken flu shot this season.  Review of Systems Unremarkable    Objective:   Physical Exam HEENT remarkable for clear rhinorrhea and postnasal drainage. Pharynx is erythematous. Tonsils surgically removed. No cervical adenopathy. Neck is supple. Lungs CTA and heart has regular rate and rhythm.       Assessment & Plan: Viral illness   Patient given discharge care instructions. Patient advised to take one felt DM, ibuprofen, and Magic mouthwash as directed. Patient given a work note. Patient denies to follow with PCP if no improvement 3-5 days.

## 2017-07-30 ENCOUNTER — Ambulatory Visit: Payer: Self-pay | Admitting: Family Medicine

## 2017-07-30 DIAGNOSIS — J9801 Acute bronchospasm: Secondary | ICD-10-CM | POA: Diagnosis not present

## 2017-07-30 DIAGNOSIS — J209 Acute bronchitis, unspecified: Secondary | ICD-10-CM | POA: Diagnosis not present

## 2017-07-30 DIAGNOSIS — R509 Fever, unspecified: Secondary | ICD-10-CM | POA: Diagnosis not present

## 2017-07-30 DIAGNOSIS — J019 Acute sinusitis, unspecified: Secondary | ICD-10-CM | POA: Diagnosis not present

## 2017-07-30 DIAGNOSIS — R05 Cough: Secondary | ICD-10-CM | POA: Diagnosis not present

## 2017-08-17 DIAGNOSIS — J0191 Acute recurrent sinusitis, unspecified: Secondary | ICD-10-CM | POA: Diagnosis not present

## 2017-08-20 ENCOUNTER — Telehealth: Payer: 59 | Admitting: Family

## 2017-08-20 DIAGNOSIS — B373 Candidiasis of vulva and vagina: Secondary | ICD-10-CM

## 2017-08-20 DIAGNOSIS — B3731 Acute candidiasis of vulva and vagina: Secondary | ICD-10-CM

## 2017-08-20 MED ORDER — FLUCONAZOLE 150 MG PO TABS: 150.0000 mg | ORAL_TABLET | Freq: Once | ORAL | 0 refills | Status: AC

## 2017-08-20 NOTE — Progress Notes (Signed)

## 2017-10-15 ENCOUNTER — Telehealth: Payer: 59 | Admitting: Nurse Practitioner

## 2017-10-15 ENCOUNTER — Telehealth: Payer: 59 | Admitting: Family

## 2017-10-15 DIAGNOSIS — R1115 Cyclical vomiting syndrome unrelated to migraine: Secondary | ICD-10-CM

## 2017-10-15 DIAGNOSIS — R112 Nausea with vomiting, unspecified: Secondary | ICD-10-CM | POA: Diagnosis not present

## 2017-10-15 MED ORDER — ONDANSETRON 4 MG PO TBDP: 4.0000 mg | ORAL_TABLET | Freq: Three times a day (TID) | ORAL | 0 refills | Status: DC | PRN

## 2017-10-15 MED ORDER — ONDANSETRON HCL 4 MG PO TABS: 4.0000 mg | ORAL_TABLET | Freq: Three times a day (TID) | ORAL | 0 refills | Status: DC | PRN

## 2017-10-15 NOTE — Progress Notes (Signed)
Thank you for the details you included in the comment boxes. Those details are very helpful in determining the best course of treatment for you and help us to provide the best care.  We are sorry that you are not feeling well. Here is how we plan to help!  Based on what you have shared with me it looks like you have a Virus that is irritating your GI tract.  Vomiting is the forceful emptying of a portion of the stomach's content through the mouth.  Although nausea and vomiting can make you feel miserable, it's important to remember that these are not diseases, but rather symptoms of an underlying illness.  When we treat short term symptoms, we always caution that any symptoms that persist should be fully evaluated in a medical office.  I have prescribed a medication that will help alleviate your symptoms and allow you to stay hydrated:  Zofran 4 mg 1 tablet every 8 hours as needed for nausea and vomiting  HOME CARE:  Drink clear liquids.  This is very important! Dehydration (the lack of fluid) can lead to a serious complication.  Start off with 1 tablespoon every 5 minutes for 8 hours.  You may begin eating bland foods after 8 hours without vomiting.  Start with saltine crackers, white bread, rice, mashed potatoes, applesauce.  After 48 hours on a bland diet, you may resume a normal diet.  Try to go to sleep.  Sleep often empties the stomach and relieves the need to vomit.  GET HELP RIGHT AWAY IF:   Your symptoms do not improve or worsen within 2 days after treatment.  You have a fever for over 3 days.  You cannot keep down fluids after trying the medication.  MAKE SURE YOU:   Understand these instructions.  Will watch your condition.  Will get help right away if you are not doing well or get worse.   Thank you for choosing an e-visit. Your e-visit answers were reviewed by a board certified advanced clinical practitioner to complete your personal care plan. Depending upon the  condition, your plan could have included both over the counter or prescription medications. Please review your pharmacy choice. Be sure that the pharmacy you have chosen is open so that you can pick up your prescription now.  If there is a problem you may message your provider in MyChart to have the prescription routed to another pharmacy. Your safety is important to us. If you have drug allergies check your prescription carefully.  For the next 24 hours, you can use MyChart to ask questions about today's visit, request a non-urgent call back, or ask for a work or school excuse from your e-visit provider. You will get an e-mail in the next two days asking about your experience. I hope that your e-visit has been valuable and will speed your recovery.    

## 2017-10-15 NOTE — Progress Notes (Signed)

## 2017-12-08 DIAGNOSIS — Z01419 Encounter for gynecological examination (general) (routine) without abnormal findings: Secondary | ICD-10-CM | POA: Diagnosis not present

## 2017-12-08 DIAGNOSIS — Z6830 Body mass index (BMI) 30.0-30.9, adult: Secondary | ICD-10-CM | POA: Diagnosis not present

## 2017-12-10 LAB — HM PAP SMEAR

## 2018-01-04 ENCOUNTER — Telehealth: Payer: 59 | Admitting: Family

## 2018-01-04 DIAGNOSIS — B9689 Other specified bacterial agents as the cause of diseases classified elsewhere: Secondary | ICD-10-CM

## 2018-01-04 DIAGNOSIS — J019 Acute sinusitis, unspecified: Secondary | ICD-10-CM

## 2018-01-04 MED ORDER — AMOXICILLIN-POT CLAVULANATE 875-125 MG PO TABS: 1.0000 | ORAL_TABLET | Freq: Two times a day (BID) | ORAL | 0 refills | Status: DC

## 2018-01-04 NOTE — Progress Notes (Signed)

## 2018-07-18 DIAGNOSIS — H5213 Myopia, bilateral: Secondary | ICD-10-CM | POA: Diagnosis not present

## 2018-07-18 DIAGNOSIS — H52223 Regular astigmatism, bilateral: Secondary | ICD-10-CM | POA: Diagnosis not present

## 2018-08-02 ENCOUNTER — Telehealth: Payer: 59 | Admitting: Physician Assistant

## 2018-08-02 DIAGNOSIS — J019 Acute sinusitis, unspecified: Secondary | ICD-10-CM | POA: Diagnosis not present

## 2018-08-02 DIAGNOSIS — B9689 Other specified bacterial agents as the cause of diseases classified elsewhere: Secondary | ICD-10-CM | POA: Diagnosis not present

## 2018-08-02 MED ORDER — AMOXICILLIN-POT CLAVULANATE 875-125 MG PO TABS: 1.0000 | ORAL_TABLET | Freq: Two times a day (BID) | ORAL | 0 refills | Status: AC

## 2018-08-02 NOTE — Progress Notes (Signed)
We are sorry that you are not feeling well.  Here is how we plan to help!  Based on what you have shared with me it looks like you have sinusitis.  Sinusitis is inflammation and infection in the sinus cavities of the head.  Based on your presentation I believe you most likely have Acute Bacterial Sinusitis.  This is an infection caused by bacteria and is treated with antibiotics. I have prescribed Augmentin 875mg /125mg  one tablet twice daily with food, for 7 days. You may use an oral decongestant such as Mucinex D or if you have glaucoma or high blood pressure use plain Mucinex. Saline nasal spray help and can safely be used as often as needed for congestion.  If you develop worsening sinus pain, fever or notice severe headache and vision changes, or if symptoms are not better after completion of antibiotic, please schedule an appointment with a health care provider.    Sinus infections are not as easily transmitted as other respiratory infection, however we still recommend that you avoid close contact with loved ones, especially the very young and elderly.  Remember to wash your hands thoroughly throughout the day as this is the number one way to prevent the spread of infection!  Home Care: Only take medications as instructed by your medical team. Complete the entire course of an antibiotic. Do not take these medications with alcohol. A steam or ultrasonic humidifier can help congestion.  You can place a towel over your head and breathe in the steam from hot water coming from a faucet. Avoid close contacts especially the very young and the elderly. Cover your mouth when you cough or sneeze. Always remember to wash your hands.  Get Help Right Away If: You develop worsening fever or sinus pain. You develop a severe head ache or visual changes. Your symptoms persist after you have completed your treatment plan.  Make sure you Understand these instructions. Will watch your condition. Will get  help right away if you are not doing well or get worse.  Your e-visit answers were reviewed by a board certified advanced clinical practitioner to complete your personal care plan.  Depending on the condition, your plan could have included both over the counter or prescription medications.  If there is a problem please reply once you have received a response from your provider.  Your safety is important to us.  If you have drug allergies check your prescription carefully.    You can use MyChart to ask questions about today's visit, request a non-urgent call back, or ask for a work or school excuse for 24 hours related to this e-Visit. If it has been greater than 24 hours you will need to follow up with your provider, or enter a new e-Visit to address those concerns.  You will get an e-mail in the next two days asking about your experience.  I hope that your e-visit has been valuable and will speed your recovery. Thank you for using e-visits.    ===View-only below this line===   ----- Message -----    From: Jetty PeeksBeth A Ramaswamy    Sent: 08/02/2018 11:07 AM EST      To: E-Visit Mailing List Subject: E-Visit Submission: Sinus Problems  E-Visit Submission: Sinus Problems --------------------------------  Question: Which of the following have you been experiencing? Answer:   Congested nose            Pain around the nose and face  Headache            Throat pain            Cough  Question: Have these symptoms significantly worsened over the last two to three days? Answer:   Yes  Question: Describe your sore throat: Answer:   Intermittent  with post nasal drainage.  Question: How long have you had a sore throat? Answer:   4 days  Question: Do you have any tenderness or swelling in your neck? Answer:   No  Question: Have you had any of the following? Answer:   None of the above  Question: How long have you been having these symptoms? Answer:   4 days  Question: Do you  have a fever? Answer:   Yes, I have a low fever (less than 101 degrees)  Question: How long have you had the fever? Answer:   For a few days  Question: Do you smoke? Answer:   No  Question: Have you ever smoked? Answer:   I have never smoked  Question: Do you have any chronic illnesses, such as diabetes, heart disease, or lung disease, or any illness that would weaken your body's ability to fight infection? Answer:   No  Question: When you blow your nose, what color is the mucus? Answer:   Mostly thick and yellow or green  Question: Have you experienced similar problems in the past? Answer:   Yes  Question: What treatments have worked in the past?  Answer:   I think I took augmentin the last time I had these symptoms and was diagnosed with sinus infection.  Question: What treatment(s) in the past have been unsuccessful? Answer:     Question: Is this illness similar to previous illnesses you have had?  How is it the same?  How is it different? Answer:   Same as previous sinus infections.  Normally get at least 1 per year.  Question: Have you recently been hospitalized? Answer:   No  Question: What medications are you currently taking for these symptoms? Answer:   Pain medicine  Question: Please enter the names of any medications you are taking, or any other treatments you are trying. Answer:   Ibuprofen  Question: Are you pregnant? Answer:   I am confident that I am not pregnant  Question: Are you breastfeeding? Answer:   No  Question: Please list your medication allergies that you may have ? (If 'none' , please list as 'none') Answer:   Nkda  Question: Please list any additional comments  Answer:

## 2018-09-05 ENCOUNTER — Encounter: Payer: Self-pay | Admitting: Physician Assistant

## 2018-09-05 ENCOUNTER — Ambulatory Visit (INDEPENDENT_AMBULATORY_CARE_PROVIDER_SITE_OTHER): Payer: Self-pay | Admitting: Physician Assistant

## 2018-09-05 VITALS — BP 100/80 | HR 72 | Temp 98.5°F | Resp 16 | Ht 67.0 in | Wt 199.0 lb

## 2018-09-05 DIAGNOSIS — R05 Cough: Secondary | ICD-10-CM

## 2018-09-05 DIAGNOSIS — R058 Other specified cough: Secondary | ICD-10-CM

## 2018-09-05 DIAGNOSIS — R12 Heartburn: Secondary | ICD-10-CM

## 2018-09-05 MED ORDER — GUAIFENESIN-CODEINE 100-10 MG/5ML PO SYRP: 5.0000 mL | ORAL_SOLUTION | Freq: Three times a day (TID) | ORAL | 0 refills | Status: DC | PRN

## 2018-09-05 MED ORDER — BENZONATATE 100 MG PO CAPS: 100.0000 mg | ORAL_CAPSULE | Freq: Three times a day (TID) | ORAL | 0 refills | Status: DC | PRN

## 2018-09-05 MED ORDER — PSEUDOEPHEDRINE HCL 60 MG PO TABS: 60.0000 mg | ORAL_TABLET | Freq: Three times a day (TID) | ORAL | 0 refills | Status: DC | PRN

## 2018-09-05 MED ORDER — CIMETIDINE 200 MG PO TABS: 200.0000 mg | ORAL_TABLET | Freq: Two times a day (BID) | ORAL | 0 refills | Status: DC

## 2018-09-05 NOTE — Progress Notes (Signed)
MRN: [id number] DOB: 1981/03/07  Subjective:   Lindsey Johnson is a 38 y.o. female presenting for chief complaint of Cough (x1wk) and green-brown mucus (x1wk) .  Reports 1 week history of illness. Started out with head cold then developed a dry hacking cough, worse at night. She is having drainage go down the back of her throat. Has some sinus pressure, no pain. Denies fever, chills, SOB, wheezing, ear pain, sore throat, N/V/D. Has tried mucinex with no full relief. No known sick contact exposure. Has history of mild seasonal allergies, no history of asthma, COPD, DM, or HTN. Patient has had flu shot this season. Denies smoking. Of note, pt reports that cough syrup with codeine is the only thing that has helped her in the past with a cough like this at night time.   Would also like a refill for zantac. Not currently having symptoms. Uses it as needed for heartburn. Notes it helped with the cough last night. She has an "emergency stash" since the recall. Has tried OTC pepcid with no full relief. Has never tried a PPI. Tries to avoid heartburn related foods. Has not followed up with her PCP regarding this issue in quite some time. Has never seen GI.   Has IUD in place for contraception.    Review of Systems  Constitutional: Negative for diaphoresis.  Respiratory: Negative for hemoptysis.   Cardiovascular: Negative for chest pain and palpitations.  Gastrointestinal: Negative for abdominal pain and blood in stool.  Neurological: Negative for dizziness and headaches.    Lindsey Johnson has a current medication list which includes the following prescription(s): ibuprofen, levonorgestrel, levothyroxine, and ranitidine. Also is allergic to other.  Lindsey Johnson  has a past medical history of Acute blood loss anemia (06/16/2015), Allergy, Feeling pelvic pressure during pregnancy in second trimester, antepartum (04/11/2015), GERD (gastroesophageal reflux disease), Goiter, unspecified, Hashimoto's thyroiditis, Hives,  Hyposmolality and/or hyponatremia, Maternal iron deficiency anemia (06/16/2015), Nontoxic uninodular goiter, Other malaise and fatigue, PONV (postoperative nausea and vomiting), Postpartum care following vaginal delivery (10/15) (06/15/2015), Premenstrual tension syndromes, Scanty or infrequent menstruation, and Screening for lipoid disorders. Also  has a past surgical history that includes Tonsillectomy (2007); Total thyroidectomy; Dilation and evacuation (04/17/2011); and Cervical cerclage (N/A, 12/25/2014).   Objective:   Vitals: BP 100/80 (BP Location: Right Arm, Patient Position: Sitting, Cuff Size: Normal)   Pulse 72   Temp 98.5 F (36.9 C)   Resp 16   Ht 5\' 7"  (1.702 m)   Wt 199 lb (90.3 kg)   SpO2 98%   BMI 31.17 kg/m   Physical Exam Vitals signs reviewed.  Constitutional:      General: She is not in acute distress.    Appearance: She is well-developed. She is not ill-appearing.  HENT:     Head: Normocephalic and atraumatic.     Right Ear: Tympanic membrane, ear canal and external ear normal.     Left Ear: Tympanic membrane, ear canal and external ear normal.     Nose: Mucosal edema (mild) present.     Right Sinus: No maxillary sinus tenderness or frontal sinus tenderness.     Left Sinus: No maxillary sinus tenderness or frontal sinus tenderness.     Mouth/Throat:     Lips: Pink.     Mouth: Mucous membranes are moist.     Pharynx: Oropharynx is clear. Uvula midline. No pharyngeal swelling, oropharyngeal exudate, posterior oropharyngeal erythema or uvula swelling.     Tonsils: Swelling: 0 on the right. 0 on the left.  Eyes:     Conjunctiva/sclera: Conjunctivae normal.  Neck:     Musculoskeletal: Normal range of motion.  Cardiovascular:     Rate and Rhythm: Normal rate and regular rhythm.     Heart sounds: Normal heart sounds.  Pulmonary:     Effort: Pulmonary effort is normal.     Breath sounds: Normal breath sounds. No decreased breath sounds, wheezing, rhonchi or  rales.  Lymphadenopathy:     Head:     Right side of head: No submental, submandibular, tonsillar, preauricular, posterior auricular or occipital adenopathy.     Left side of head: No submental, submandibular, tonsillar, preauricular, posterior auricular or occipital adenopathy.     Cervical: No cervical adenopathy.     Upper Body:     Right upper body: No supraclavicular adenopathy.     Left upper body: No supraclavicular adenopathy.  Skin:    General: Skin is warm and dry.  Neurological:     Mental Status: She is alert.     No results found for this or any previous visit (from the past 24 hour(s)).  Assessment and Plan :  1. Post-viral cough syndrome Pt is overall well appearing, NAD. VSS. She is afebrile. Lungs CTAB. This is consistent with post viral cough. Rec symptomatic tx at this time. Exam findings, diagnosis etiology, medication use, indications, side effects, risks, benefits, and alternatives of the medications and treatment plan prescribed today and reviewed with patient. Follow- Up and discharge instructions provided. No emergent/urgent issues found on exam. Patient education was provided. Patient verbalized understanding of information provided and agrees with plan of care (POC), all questions answered. No barriers to understanding were identified. Red flags discussed in detail. The patient is advised to call or return to clinic if condition does not see an improvement in symptoms, or to seek the care of the closest emergency department if condition worsens with the above plan.  - guaiFENesin-codeine (CHERATUSSIN AC) 100-10 MG/5ML syrup; Take 5 mLs by mouth 3 (three) times daily as needed for cough.  Dispense: 120 mL; Refill: 0 - pseudoephedrine (SUDAFED) 60 MG tablet; Take 1 tablet (60 mg total) by mouth every 8 (eight) hours as needed for congestion.  Dispense: 30 tablet; Refill: 0 - benzonatate (TESSALON) 100 MG capsule; Take 1-2 capsules (100-200 mg total) by mouth 3 (three)  times daily as needed for cough.  Dispense: 40 capsule; Refill: 0  2. Heartburn Currently asx. Provided pt with Rx of cimetidine due to recent recall of zantac. Pt was slightly upset that I would not give her a refill of zantac despite explaining why the recall. She states "well cigarettes cause cancer."  I informed pt that she will need to follow up with her PCP to discuss further zantac refills. In the meantime, can try cimetidine or OTC tums or nexium or prilosec. Pt voices understanding.  - cimetidine (TAGAMET) 200 MG tablet; Take 1 tablet (200 mg total) by mouth 2 (two) times daily. Take 30 minutes prior to eating foods or beverages that cause heartburn.  Dispense: 30 tablet; Refill: 0   Benjiman Core, PA-C  [facility] 09/05/2018 8:20 AM

## 2018-09-05 NOTE — Patient Instructions (Addendum)
Acute Bronchitis, Adult   Use sudafed tablets throughout the day.  Use tessalon perles to stop the cough during the day.  Use cough syrup at night time. This can make you drowsy so use cautiously. Use cimetidine for acid reflux. If any of your symptoms worsen or you develop new fever, sinus pain, or wheezing, please follow up with Lindsey Johnson, family doctor, or urgent care.  If your symptoms do not fully resolve over the next 1-2 weeks, please let us know.    Acute bronchitis is when air tubes (bronchi) in the lungs suddenly get swollen. The condition can make it hard to breathe. It can also cause these symptoms:  A cough.  Coughing up clear, yellow, or green mucus.  Wheezing.  Chest congestion.  Shortness of breath.  A fever.  Body aches.  Chills.  A sore throat. Follow these instructions at home:  Medicines  Take over-the-counter and prescription medicines only as told by your doctor.  If you were prescribed an antibiotic medicine, take it as told by your doctor. Do not stop taking the antibiotic even if you start to feel better. General instructions  Rest.  Drink enough fluids to keep your pee (urine) pale yellow.  Avoid smoking and secondhand smoke. If you smoke and you need help quitting, ask your doctor. Quitting will help your lungs heal faster.  Use an inhaler, cool mist vaporizer, or humidifier as told by your doctor.  Keep all follow-up visits as told by your doctor. This is important. How is this prevented? To lower your risk of getting this condition again:  Wash your hands often with soap and water. If you cannot use soap and water, use hand sanitizer.  Avoid contact with people who have cold symptoms.  Try not to touch your hands to your mouth, nose, or eyes.  Make sure to get the flu shot every year. Contact a doctor if:  Your symptoms do not get better in 2 weeks. Get help right away if:  You cough up blood.  You have chest pain.  You  have very bad shortness of breath.  You become dehydrated.  You faint (pass out) or keep feeling like you are going to pass out.  You keep throwing up (vomiting).  You have a very bad headache.  Your fever or chills gets worse. This information is not intended to replace advice given to you by your health care provider. Make sure you discuss any questions you have with your health care provider. Document Released: 02/03/2008 Document Revised: 03/31/2017 Document Reviewed: 02/05/2016 Elsevier Interactive Patient Education  2019 [employer].

## 2018-09-08 ENCOUNTER — Telehealth: Payer: Self-pay | Admitting: Emergency Medicine

## 2018-09-08 NOTE — Telephone Encounter (Signed)
Spoke with patient whom stated that she is pretty much the same asked her if she tried any of the prescribed medication she stated no but has appointment with her PCP tomorrow.

## 2018-09-09 ENCOUNTER — Ambulatory Visit (INDEPENDENT_AMBULATORY_CARE_PROVIDER_SITE_OTHER)
Admission: RE | Admit: 2018-09-09 | Discharge: 2018-09-09 | Disposition: A | Discharge status: Home / Self Care | Payer: 59 | Source: Ambulatory Visit | Attending: Family Medicine | Admitting: Family Medicine

## 2018-09-09 ENCOUNTER — Ambulatory Visit: Payer: 59 | Admitting: Family Medicine

## 2018-09-09 ENCOUNTER — Encounter: Payer: Self-pay | Admitting: Family Medicine

## 2018-09-09 VITALS — BP 90/60 | HR 66 | Temp 98.9°F | Ht 67.0 in | Wt 196.8 lb

## 2018-09-09 DIAGNOSIS — B9789 Other viral agents as the cause of diseases classified elsewhere: Secondary | ICD-10-CM | POA: Diagnosis not present

## 2018-09-09 DIAGNOSIS — M25571 Pain in right ankle and joints of right foot: Secondary | ICD-10-CM | POA: Diagnosis not present

## 2018-09-09 DIAGNOSIS — J069 Acute upper respiratory infection, unspecified: Secondary | ICD-10-CM | POA: Diagnosis not present

## 2018-09-09 MED ORDER — HYDROCODONE-HOMATROPINE 5-1.5 MG/5ML PO SYRP: 5.0000 mL | ORAL_SOLUTION | Freq: Every evening | ORAL | 0 refills | Status: DC | PRN

## 2018-09-09 NOTE — Patient Instructions (Addendum)
Rest, fluids.  Can use cough suppressant at night.  Wear air cast when up on feet.  Start home ankle rehab exercises.  Ibuprofen 800 mg every eight hours for pain and swelling in ankle.  Ice ankle.  Follow up in 2 week for re-eval.

## 2018-09-09 NOTE — Assessment & Plan Note (Signed)
X-ray unremarkable per my read.Marland Kitchen await final radiology reading.  Given air cast.  Recommend NSAIDs, ICe, elevation start ankle rehab.

## 2018-09-09 NOTE — Assessment & Plan Note (Signed)
Symptomatic care. Rx for cough suppressant sent in.

## 2018-09-09 NOTE — Progress Notes (Signed)
Subjective:    Patient ID: Lindsey Johnson, female    DOB: 1980-09-02, 38 y.o.   MRN: [id number]  HPI  38 year old female pt presents with right ankle pain following injury on a slide at [place]  In 05/2018.   She was getting on kiddies slide.. forced  Ankle eversion. Immediate pain in medial ankle.. Was able to weight bear. stabbing pain.  No swelling, no bruising    Worse in last week after walking around [employer].  Worse when up on feet a long time.  Sharp focal pain anterior to medical malleolus. Has treated with ice.  Wrapped with ACE bandage.   Cough and congestion in last 1.5 weeks.Marland Kitchen gradually moving to chest and more cough. Dx with viral URI. Requests tussionex rx as codeine cough suppressant does not help.  Social History /Family History/Past Medical History reviewed in detail and updated in EMR if needed. Blood pressure 90/60, pulse 66, temperature 98.9 F (37.2 C), temperature source Oral, height 5\' 7"  (1.702 m), weight 196 lb 12 oz (89.2 kg), SpO2 99 %, currently breastfeeding.  Review of Systems  Constitutional: Negative for fatigue and fever.  HENT: Negative for congestion.   Eyes: Negative for pain.  Respiratory: Negative for cough and shortness of breath.   Cardiovascular: Negative for chest pain, palpitations and leg swelling.  Gastrointestinal: Negative for abdominal pain.  Genitourinary: Negative for dysuria and vaginal bleeding.  Musculoskeletal: Negative for back pain.  Neurological: Negative for syncope, light-headedness and headaches.  Psychiatric/Behavioral: Negative for dysphoric mood.       Objective:   Physical Exam Constitutional:      General: She is not in acute distress.    Appearance: She is well-developed. She is not ill-appearing or toxic-appearing.  HENT:     Head: Normocephalic.     Right Ear: Hearing, tympanic membrane, ear canal and external ear normal. Tympanic membrane is not erythematous, retracted or bulging.   Left Ear: Hearing, tympanic membrane, ear canal and external ear normal. Tympanic membrane is not erythematous, retracted or bulging.     Nose: Mucosal edema and rhinorrhea present.     Right Sinus: No maxillary sinus tenderness or frontal sinus tenderness.     Left Sinus: No maxillary sinus tenderness or frontal sinus tenderness.     Mouth/Throat:     Pharynx: Uvula midline.  Eyes:     General: Lids are normal. Lids are everted, no foreign bodies appreciated.     Conjunctiva/sclera: Conjunctivae normal.     Pupils: Pupils are equal, round, and reactive to light.  Neck:     Musculoskeletal: Normal range of motion and neck supple.     Thyroid: No thyroid mass or thyromegaly.     Vascular: No carotid bruit.     Trachea: Trachea normal.  Cardiovascular:     Rate and Rhythm: Normal rate and regular rhythm.     Pulses: Normal pulses.     Heart sounds: Normal heart sounds, S1 normal and S2 normal. No murmur. No friction rub. No gallop.   Pulmonary:     Effort: Pulmonary effort is normal. No tachypnea or respiratory distress.     Breath sounds: Normal breath sounds. No decreased breath sounds, wheezing, rhonchi or rales.  Musculoskeletal:     Right ankle: She exhibits decreased range of motion and swelling. She exhibits no ecchymosis and no deformity. Tenderness. Medial malleolus tenderness found. No lateral malleolus, no CF ligament, no posterior TFL, no head of 5th metatarsal and  no proximal fibula tenderness found. Achilles tendon normal.       Feet:  Skin:    General: Skin is warm and dry.     Findings: No rash.  Neurological:     Mental Status: She is alert.  Psychiatric:        Mood and Affect: Mood is not anxious or depressed.        Speech: Speech normal.        Behavior: Behavior normal. Behavior is cooperative.        Judgment: Judgment normal.           Assessment & Plan:

## 2018-09-26 ENCOUNTER — Encounter: Payer: Self-pay | Admitting: Family Medicine

## 2018-12-01 ENCOUNTER — Telehealth: Payer: 59 | Admitting: Family

## 2018-12-01 DIAGNOSIS — L237 Allergic contact dermatitis due to plants, except food: Secondary | ICD-10-CM

## 2018-12-01 MED ORDER — PREDNISONE 10 MG PO TABS: 10.0000 mg | ORAL_TABLET | Freq: Every day | ORAL | 0 refills | Status: DC

## 2018-12-01 NOTE — Progress Notes (Signed)
E Visit for Rash  We are sorry that you are not feeling well. Here is how we plan to help!  I have prescribed Prednisone 10 mg daily for 5 days    HOME CARE:   Take cool showers and avoid direct sunlight.  Apply cool compress or wet dressings.  Take a bath in an oatmeal bath.  Sprinkle content of one Aveeno packet under running faucet with comfortably warm water.  Bathe for 15-20 minutes, 1-2 times daily.  Pat dry with a towel. Do not rub the rash.  Use hydrocortisone cream.  Take an antihistamine like Benadryl for widespread rashes that itch.  The adult dose of Benadryl is 25-50 mg by mouth 4 times daily.  Caution:  This type of medication may cause sleepiness.  Do not drink alcohol, drive, or operate dangerous machinery while taking antihistamines.  Do not take these medications if you have prostate enlargement.  Read package instructions thoroughly on all medications that you take.  GET HELP RIGHT AWAY IF:   Symptoms don't go away after treatment.  Severe itching that persists.  If you rash spreads or swells.  If you rash begins to smell.  If it blisters and opens or develops a yellow-brown crust.  You develop a fever.  You have a sore throat.  You become short of breath.  MAKE SURE YOU:  Understand these instructions. Will watch your condition. Will get help right away if you are not doing well or get worse.  Thank you for choosing an e-visit. Your e-visit answers were reviewed by a board certified advanced clinical practitioner to complete your personal care plan. Depending upon the condition, your plan could have included both over the counter or prescription medications. Please review your pharmacy choice. Be sure that the pharmacy you have chosen is open so that you can pick up your prescription now.  If there is a problem you may message your provider in MyChart to have the prescription routed to another pharmacy. Your safety is important to us. If you have  drug allergies check your prescription carefully.  For the next 24 hours, you can use MyChart to ask questions about today's visit, request a non-urgent call back, or ask for a work or school excuse from your e-visit provider. You will get an email in the next two days asking about your experience. I hope that your e-visit has been valuable and will speed your recovery.     

## 2018-12-26 MED FILL — LEVOTHYROXINE 200 MCG TAB: 200 | 90 days supply | Qty: 90 | Fill #0

## 2019-01-19 ENCOUNTER — Other Ambulatory Visit: Payer: Self-pay

## 2019-01-19 ENCOUNTER — Encounter: Payer: Self-pay | Admitting: Dietician

## 2019-01-19 ENCOUNTER — Encounter: Payer: 59 | Attending: Family Medicine | Admitting: Dietician

## 2019-01-19 VITALS — Ht 67.0 in | Wt 193.4 lb

## 2019-01-19 DIAGNOSIS — Z713 Dietary counseling and surveillance: Secondary | ICD-10-CM

## 2019-01-19 NOTE — Patient Instructions (Signed)
   Allow for small portions of healthy carb foods with meals, up to 45grams carb.   Continue with regular eating pattern for night-shift hours.   Eat small amounts of higher-fat meats like steak and ribs. Can eat more of leaner meats like fish/ salmon, chicken, [place].   Add more physical activity as able.   Keep track of food intake and activity using app (MyFitnessPal). Aim for about 1400 calorie average.

## 2019-01-19 NOTE — Progress Notes (Signed)
[employer] Employee "self referral" nutrition session: Start time: 1435   End time: 3570  Height: 5'7" Weight: 193.4lbs  Met with employee to discuss his/her nutritional concerns and diet history.   Diet history:   Patient reports weight gain over the past several years.   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 She is planning to eat between 12 and 8pm on days she does not work.   Typical eating pattern: Breakfast: coffee with sugar free creamer Snack: none Lunch: today protein shake; usu meats, eggs, proteins, yogurt; was eating fast foods until last week Snack: almonds Supper: often steak, ribs, smoked pork (husband doesn't like chicken) + veg ie broccoli, cauliflower, Brussels sprouts, green beans, occ blend with sweet potato Snack: almonds; popcorn with cheese before last week Beverages: water sometimes with flavoring pak, coffee, , occ 1/2 Coke zero at work at night; [employer] ginger ale with burbon   Education topics covered during this visit:  General nutrition/ Healthy eating  Exercise  Weight Concerns   Educational resources provided:  Museum/gallery conservator with food lists; added specific nutrient goals for 1400kcal daily intake  Fast Carbs, Slow Carbs article (CSPI)   Additional Comments:  Advised at least 40% of caloric intake from carbohydrates, mostly healthy, high fiber/ whole grain choices. This would be about 140g CHO daily, or 9-10 servings.   Advised 30% fat and 30% protein (11-12oz of protein foods)  Discussed lean protein choices.     Plan:   Patient will follow modified intermittent fasting pattern to suit her schedule and needs.   Established other goals with direction from patient.

## 2019-02-14 DIAGNOSIS — Z01419 Encounter for gynecological examination (general) (routine) without abnormal findings: Secondary | ICD-10-CM | POA: Diagnosis not present

## 2019-02-14 DIAGNOSIS — Z6829 Body mass index (BMI) 29.0-29.9, adult: Secondary | ICD-10-CM | POA: Diagnosis not present

## 2019-02-14 DIAGNOSIS — E063 Autoimmune thyroiditis: Secondary | ICD-10-CM | POA: Diagnosis not present

## 2019-02-20 ENCOUNTER — Ambulatory Visit: Payer: 59 | Admitting: Dietician

## 2019-03-20 MED FILL — LEVOTHYROXINE 200 MCG TAB: 200 | 90 days supply | Qty: 90 | Fill #0

## 2019-03-23 ENCOUNTER — Encounter: Payer: Self-pay | Admitting: Dietician

## 2019-03-23 NOTE — Progress Notes (Signed)
Have not yet heard back from patient to reschedule her missed appointment on 02/20/19. At [facility] time she does not have any nutrition therapy appointments.

## 2019-03-26 ENCOUNTER — Telehealth: Payer: 59 | Admitting: Family

## 2019-03-26 DIAGNOSIS — M546 Pain in thoracic spine: Secondary | ICD-10-CM | POA: Diagnosis not present

## 2019-03-26 MED ORDER — NAPROXEN 500 MG PO TABS: 500.0000 mg | ORAL_TABLET | Freq: Two times a day (BID) | ORAL | 0 refills | Status: DC

## 2019-03-26 MED ORDER — BACLOFEN 10 MG PO TABS: 10.0000 mg | ORAL_TABLET | Freq: Three times a day (TID) | ORAL | 0 refills | Status: DC

## 2019-03-26 NOTE — Progress Notes (Signed)

## 2019-06-12 MED FILL — LEVOTHYROXINE 200 MCG TAB: 200 | 90 days supply | Qty: 90 | Fill #1

## 2019-07-10 MED FILL — BUPROPION HCL XL 150 MG TAB: 150 | 30 days supply | Qty: 30 | Fill #0

## 2019-08-04 MED FILL — BUPROPION HCL XL 150 MG TAB: 150 | 30 days supply | Qty: 30 | Fill #1

## 2019-08-20 DIAGNOSIS — Z20828 Contact with and (suspected) exposure to other viral communicable diseases: Secondary | ICD-10-CM | POA: Diagnosis not present

## 2019-09-04 MED FILL — BUPROPION HCL XL 150 MG TAB: 150 | 30 days supply | Qty: 30 | Fill #0

## 2019-09-12 MED FILL — LEVOTHYROXINE SODIUM 200 MC: 200 | 90 days supply | Qty: 90 | Fill #2

## 2019-09-29 MED FILL — buPROPion HCL ER (XL) 150 M: 150 | 30 days supply | Qty: 30 | Fill #1

## 2019-10-28 MED FILL — buPROPion HCL ER (XL) 150 M: 150 | 30 days supply | Qty: 30 | Fill #2

## 2021-02-07 ENCOUNTER — Telehealth: Payer: 59 | Admitting: Physician Assistant

## 2021-02-07 DIAGNOSIS — J019 Acute sinusitis, unspecified: Secondary | ICD-10-CM

## 2021-02-07 DIAGNOSIS — B9689 Other specified bacterial agents as the cause of diseases classified elsewhere: Secondary | ICD-10-CM

## 2021-02-07 MED ORDER — AMOXICILLIN-POT CLAVULANATE 875-125 MG PO TABS: 1.0000 | ORAL_TABLET | Freq: Two times a day (BID) | ORAL | 0 refills | Status: AC

## 2021-02-07 NOTE — Progress Notes (Signed)
We are sorry that you are not feeling well.  Here is how we plan to help!  Based on what you have shared with me it looks like you have sinusitis.  Sinusitis is inflammation and infection in the sinus cavities of the head.  Based on your presentation I believe you most likely have Acute Bacterial Sinusitis.  This is an infection caused by bacteria and is treated with antibiotics. I have prescribed Augmentin 875mg/125mg one tablet twice daily with food, for 7 days. You may use an oral decongestant such as Mucinex D or if you have glaucoma or high blood pressure use plain Mucinex. Saline nasal spray help and can safely be used as often as needed for congestion.  If you develop worsening sinus pain, fever or notice severe headache and vision changes, or if symptoms are not better after completion of antibiotic, please schedule an appointment with a health care provider.    Sinus infections are not as easily transmitted as other respiratory infection, however we still recommend that you avoid close contact with loved ones, especially the very young and elderly.  Remember to wash your hands thoroughly throughout the day as this is the number one way to prevent the spread of infection!  Home Care:  Only take medications as instructed by your medical team.  Complete the entire course of an antibiotic.  Do not take these medications with alcohol.  A steam or ultrasonic humidifier can help congestion.  You can place a towel over your head and breathe in the steam from hot water coming from a faucet.  Avoid close contacts especially the very young and the elderly.  Cover your mouth when you cough or sneeze.  Always remember to wash your hands.  Get Help Right Away If:  You develop worsening fever or sinus pain.  You develop a severe head ache or visual changes.  Your symptoms persist after you have completed your treatment plan.  Make sure you  Understand these instructions.  Will watch your  condition.  Will get help right away if you are not doing well or get worse.  Your e-visit answers were reviewed by a board certified advanced clinical practitioner to complete your personal care plan.  Depending on the condition, your plan could have included both over the counter or prescription medications.  If there is a problem please reply  once you have received a response from your provider.  Your safety is important to us.  If you have drug allergies check your prescription carefully.    You can use MyChart to ask questions about today's visit, request a non-urgent call back, or ask for a work or school excuse for 24 hours related to this e-Visit. If it has been greater than 24 hours you will need to follow up with your provider, or enter a new e-Visit to address those concerns.  You will get an e-mail in the next two days asking about your experience.  I hope that your e-visit has been valuable and will speed your recovery. Thank you for using e-visits.  I provided 6 minutes of non face-to-face time during this encounter for chart review and documentation.   

## 2021-07-16 ENCOUNTER — Telehealth: Payer: Self-pay | Admitting: Physician Assistant

## 2021-07-16 DIAGNOSIS — B9689 Other specified bacterial agents as the cause of diseases classified elsewhere: Secondary | ICD-10-CM

## 2021-07-16 DIAGNOSIS — J019 Acute sinusitis, unspecified: Secondary | ICD-10-CM

## 2021-07-16 MED ORDER — LEVOFLOXACIN 500 MG PO TABS: 500.0000 mg | ORAL_TABLET | Freq: Every day | ORAL | 0 refills | Status: AC

## 2021-07-16 NOTE — Progress Notes (Signed)

## 2021-07-16 NOTE — Progress Notes (Signed)
I have spent 5 minutes in review of e-visit questionnaire, review and updating patient chart, medical decision making and response to patient.   Brynlie Daza Cody Rikita Grabert, PA-C
